# Patient Record
Sex: Male | Born: 1935 | Race: White | Hispanic: No | Smoking: Never smoker
Health system: Southern US, Community
[De-identification: ages and names within clinical notes are randomized; demographics above are authoritative.]

## PROBLEM LIST (undated history)

## (undated) DIAGNOSIS — E785 Hyperlipidemia, unspecified: Secondary | ICD-10-CM

## (undated) DIAGNOSIS — C9210 Chronic myeloid leukemia, BCR/ABL-positive, not having achieved remission: Secondary | ICD-10-CM

## (undated) DIAGNOSIS — N189 Chronic kidney disease, unspecified: Secondary | ICD-10-CM

## (undated) DIAGNOSIS — I5020 Unspecified systolic (congestive) heart failure: Secondary | ICD-10-CM

## (undated) DIAGNOSIS — I350 Nonrheumatic aortic (valve) stenosis: Principal | ICD-10-CM

## (undated) DIAGNOSIS — M1990 Unspecified osteoarthritis, unspecified site: Secondary | ICD-10-CM

## (undated) DIAGNOSIS — N1832 Chronic kidney disease, stage 3b: Secondary | ICD-10-CM

## (undated) DIAGNOSIS — N19 Unspecified kidney failure: Secondary | ICD-10-CM

## (undated) HISTORY — PX: LAMINECTOMY: SHX219

## (undated) HISTORY — PX: APPENDECTOMY: SHX54

## (undated) HISTORY — DX: Nonrheumatic aortic (valve) stenosis: I35.0

## (undated) HISTORY — DX: Unspecified kidney failure: N19

## (undated) HISTORY — DX: Chronic myeloid leukemia, BCR/ABL-positive, not having achieved remission: C92.10

## (undated) HISTORY — DX: Unspecified osteoarthritis, unspecified site: M19.90

## (undated) HISTORY — DX: Chronic kidney disease, unspecified: N18.9

## (undated) HISTORY — DX: Hyperlipidemia, unspecified: E78.5

## (undated) HISTORY — PX: CHOLECYSTECTOMY: SHX55

---

## 2004-08-31 ENCOUNTER — Ambulatory Visit: Payer: Self-pay | Admitting: Cardiology

## 2005-07-05 ENCOUNTER — Ambulatory Visit: Payer: Self-pay | Admitting: Cardiology

## 2007-10-25 ENCOUNTER — Ambulatory Visit (HOSPITAL_COMMUNITY): Admission: RE | Admit: 2007-10-25 | Discharge: 2007-10-26 | Payer: Self-pay | Admitting: Neurosurgery

## 2009-05-14 IMAGING — CR DG CHEST 2V
2 series · 2 of 2 positions shown · non-contrast
Comparison: None

CLINICAL DATA: Lumbar region PE.  Spondylosis.  Preadmission for
surgery.

CHEST - 2 VIEW

[view not recorded (1 of 2)]
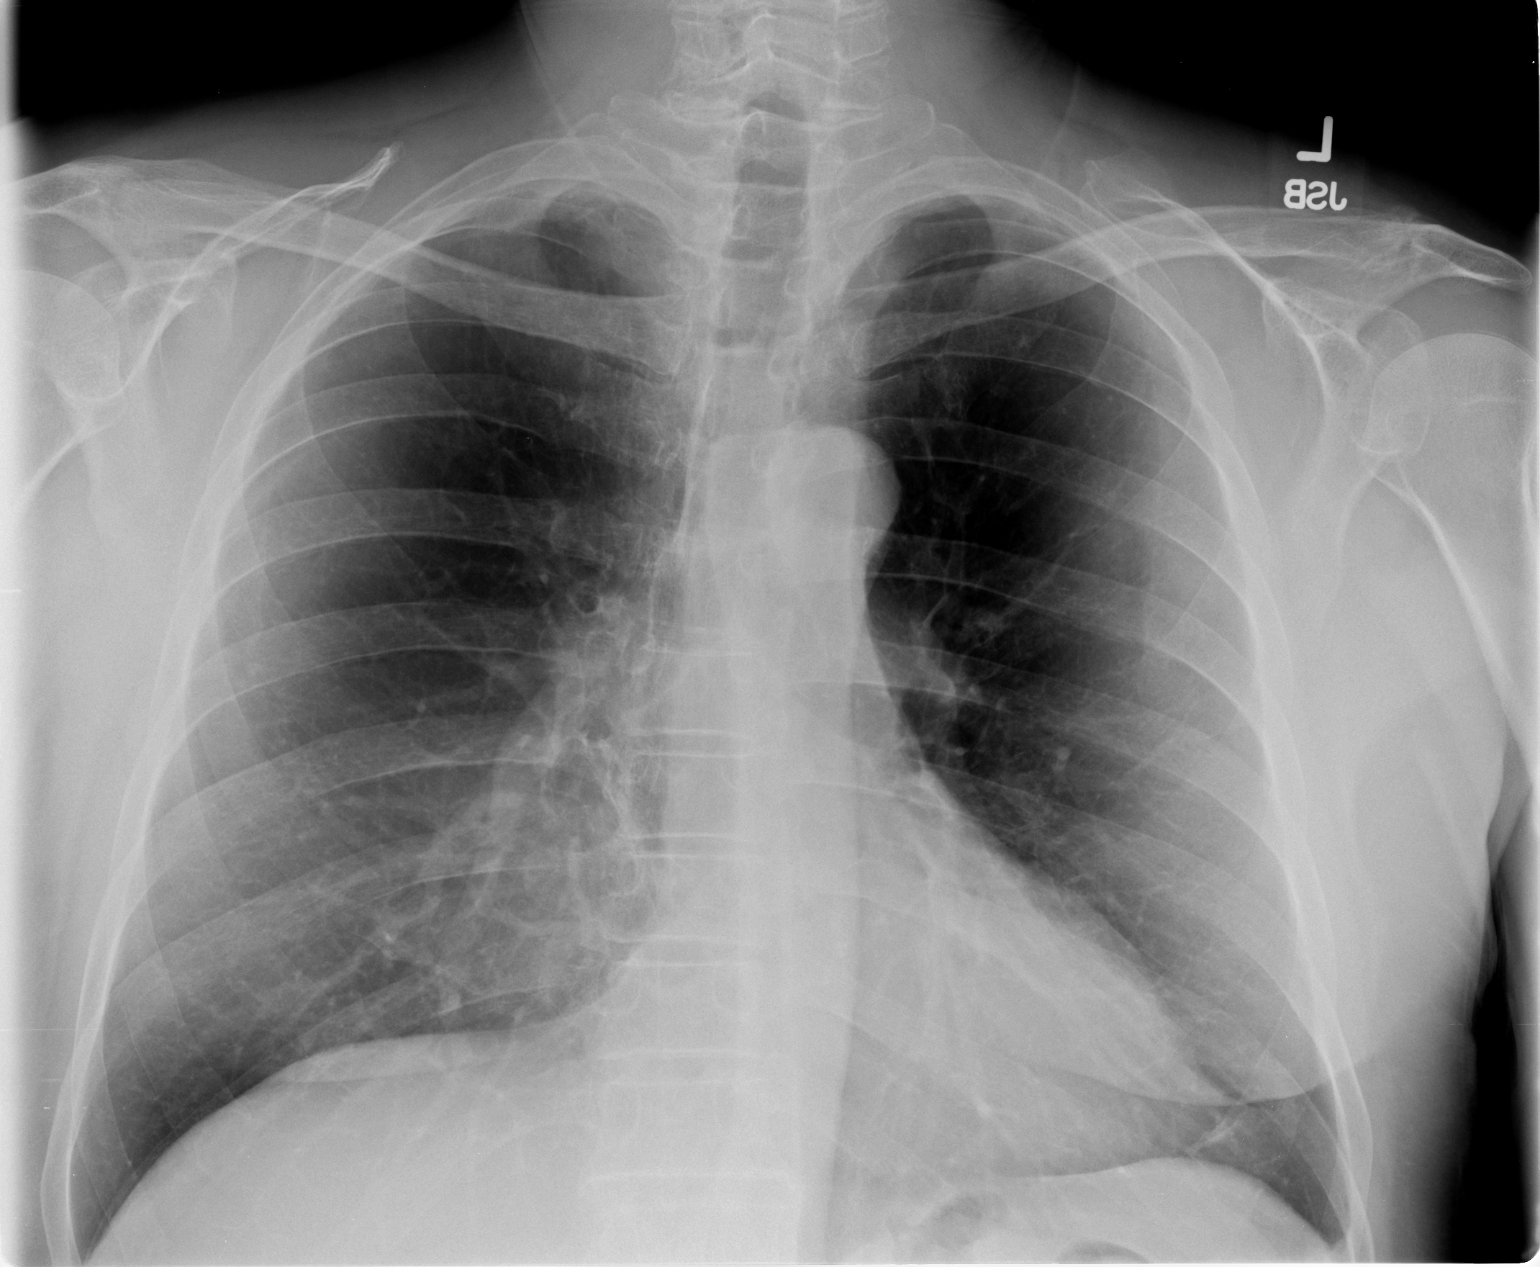

[view not recorded (2 of 2)]
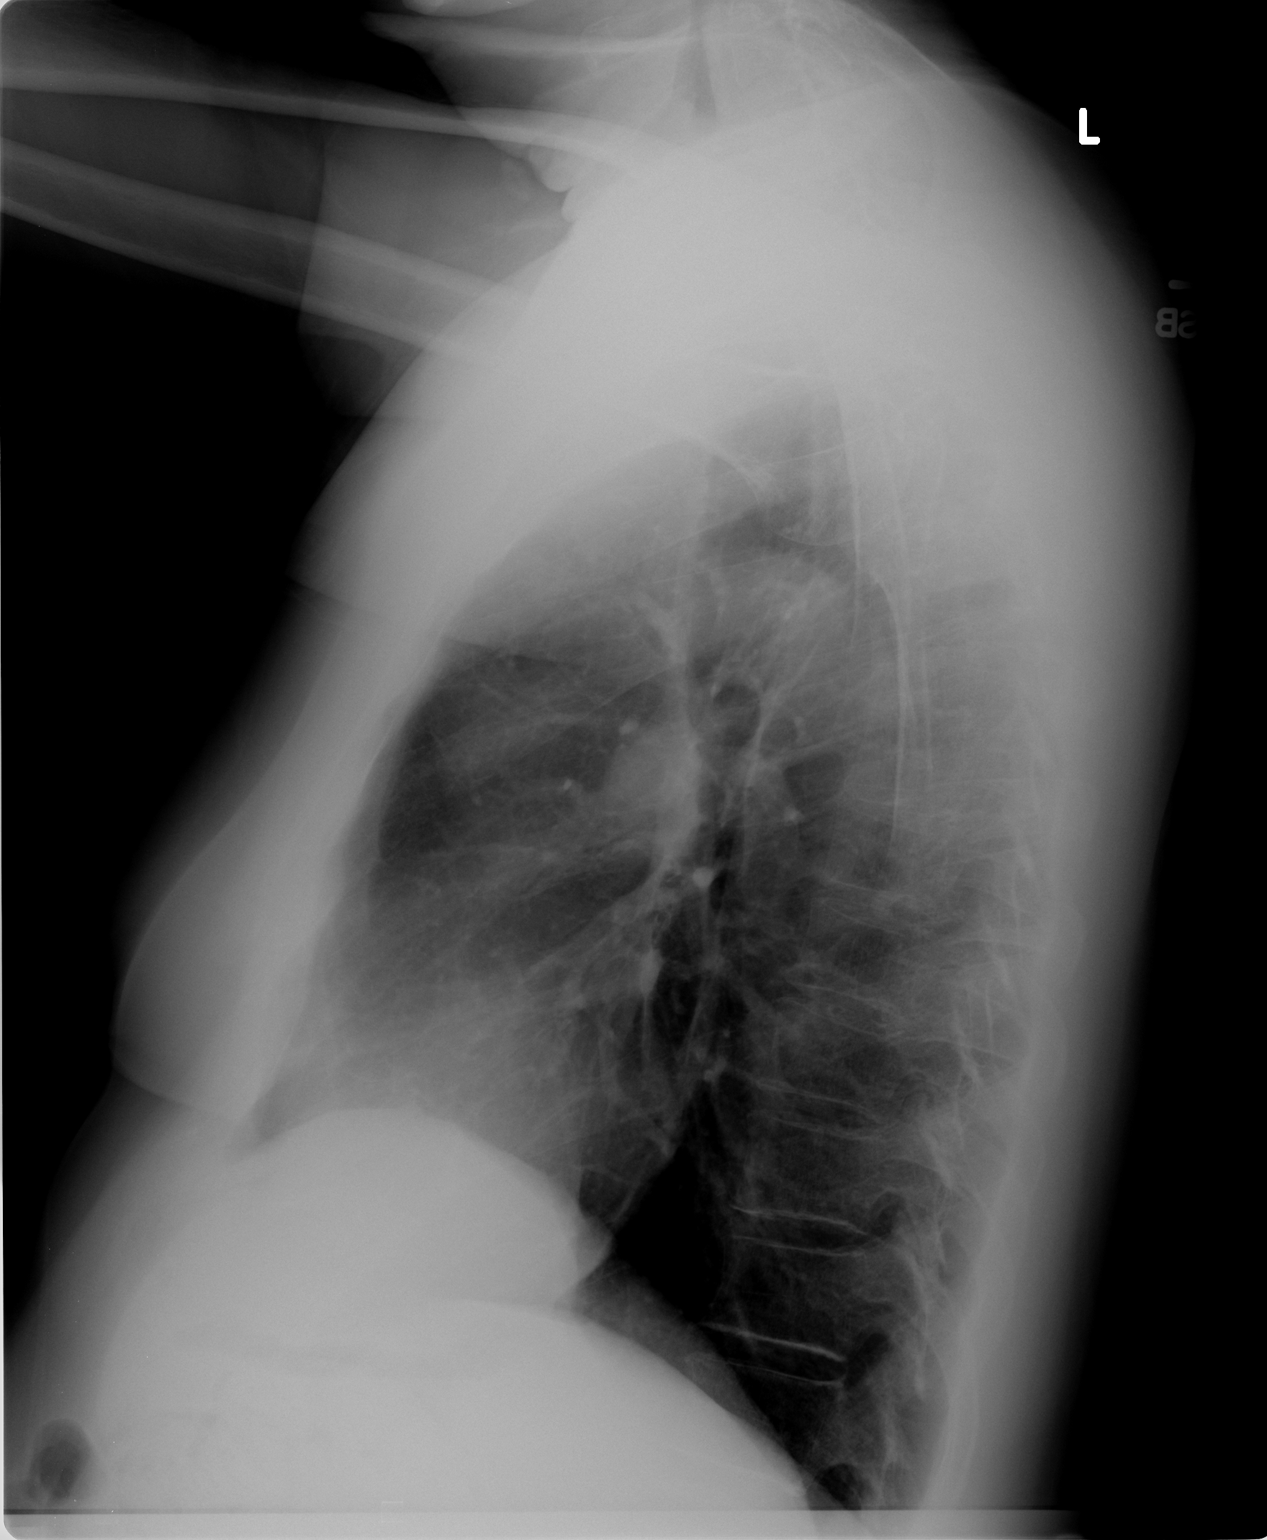

[2 of 2 positions shown; findings below may reference images not displayed]

FINDINGS: Lungs are moderately hyperaerated.  Attenuated appearance
of the pulmonary vasculature particularly in the upper lung zones
and apices.  Findings are suggestive of emphysematous changes.  No
acute pulmonary process.  Normal cardiomediastinal silhouette.
Intact bony thorax.
IMPRESSION: Radiographic findings are consistent with COPD/emphysema.

## 2009-05-15 IMAGING — CR DG LUMBAR SPINE 2-3V
1 series · 1 of 1 positions shown · non-contrast
Comparison: No prior imaging of the lumbar spine is available.

CLINICAL DATA: Lumbar HNP

LUMBAR SPINE - 2-3 VIEW

[view not recorded]
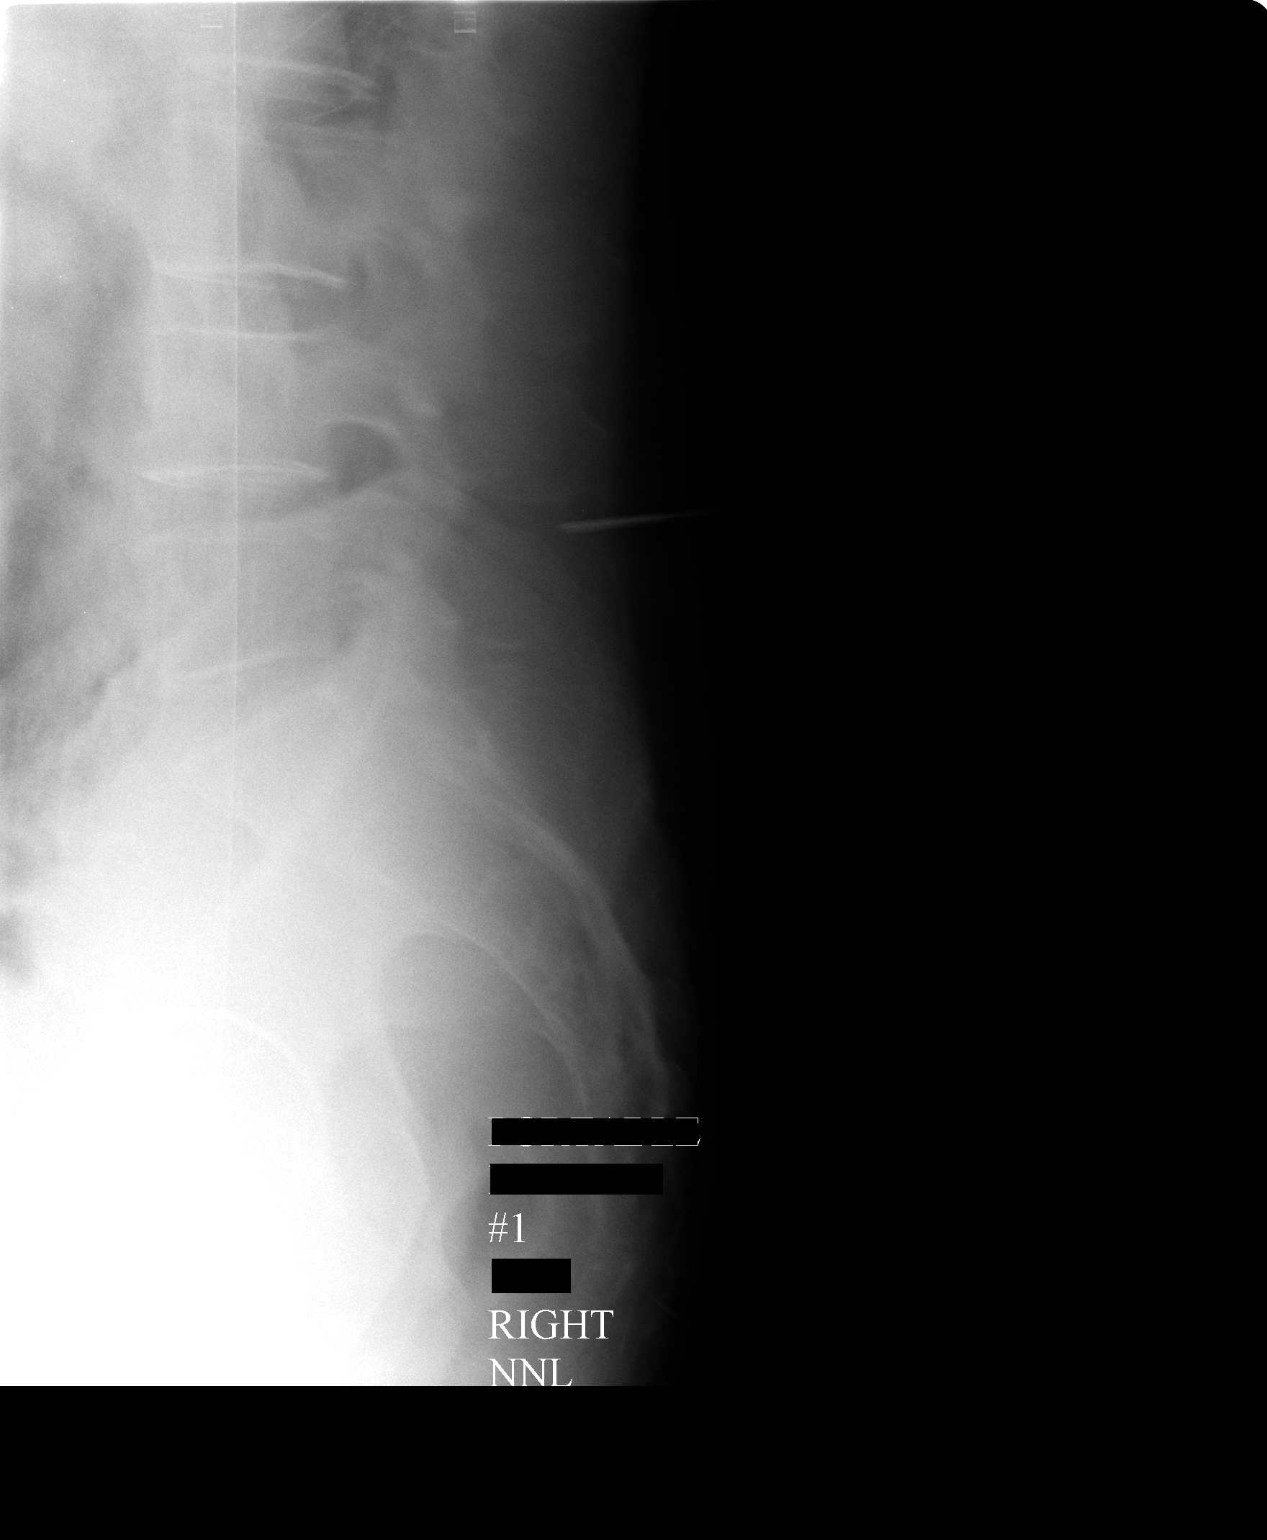

[1 of 1 positions shown; findings below may reference images not displayed]

FINDINGS: No prior images of the lumbar spine are available.  On
these limited images, it is assumed that there are five lumbar type
vertebral bodies, for numbering purposes.

Two portable cross-table lateral views of lumbar spine are
submitted.  Image labeled #1 includes the inferior plate of L2,
through the sacrum (assuming five lumbar type vertebral bodies).  A
radiopaque marker projects over the soft tissues of the back, at
the level of the L4-5 disc space.

Imaged labeled #2 includes the L1-2 disc space through the sacrum.
Radiopaque surgical instruments project at the L5 - S1 level on
image #2.
IMPRESSION: Intraoperative lumbar spine images as described above

## 2010-10-10 NOTE — Op Note (Signed)
Kenneth [name], Kenneth [name]                  ACCOUNT NO.:  [id number]   MEDICAL RECORD NO.:  [id number]          PATIENT TYPE:  INP   LOCATION:  3028                         FACILITY:  [facility]   PHYSICIAN:  Hewitt Shorts, M.D.DATE OF BIRTH:  Oct 26, 1933   DATE OF PROCEDURE:  10/25/2007  DATE OF DISCHARGE:                               OPERATIVE REPORT   PREOPERATIVE DIAGNOSES:  1. Left L5-S1 lumbar disk herniation.  2. Lumbar degenerative disk disease.  3. Lumbar spondylosis.  4. Lumbar radiculopathy.   POSTOPERATIVE DIAGNOSES:  1. Left L5-S1 lumbar disk herniation.  2. Lumbar degenerative disease.  3. Lumbar spondylosis.  4. Lumbar radiculopathy.   PROCEDURE:  Left L5-S1 lumbar laminotomy and microdiskectomy with  microdissection.   SURGEON:  Hewitt Shorts, MD   ASSISTANT:  Heslop Silversmith, NP   ANESTHESIA:  General endotracheal.   INDICATIONS:  A 77 year old man who presented with a left lumbar  radiculopathy.  CT scan showed a large left L5-S1 lumbar disk  herniation.  Decision was made to proceed for elective laminotomy and  microdiskectomy.   PROCEDURE:  The patient brought to the operating room, placed under  general endotracheal anesthesia. The patient was turned to a prone  position.  Lumbar region was prepped with Betadine soap and solution and  draped in a sterile fashion.  The midline was infiltrated with local  anesthetic with epinephrine and x-ray was taken and the L5-S1 level was  identified.  A midline incision was made over the L5-S1 level and  carried down through the subcutaneous tissue.  Bipolar electrocautery  was used to maintain hemostasis.  Dissection was carried down to the  lumbar fascia, which was incised on the left side of the midline, and  the paraspinal muscles were dissected from the spinous process and  lamina in a subperiosteal fashion.  L5-S1 intralaminar space was  identified, and x-ray was taken to the confirm the localization and then  the  microscope was draped and brought to the field to provide additional  navigation, illumination, and visualization.  The remainder of the  decompression was performed using microdissection and microsurgical  technique.  Laminotomy was performed using the X-max drill and Kerrison  punches.  The ligamentum flavum was carefully removed and we identified  the thecal sac and exiting left S1 nerve root.  This gently retracted  medially exposing a large disk herniation.  The remaining annular fibers  were incised and large fragment removed and then we continued the  diskectomy entering into the disk space using a variety of microcurettes  and pituitary rongeurs.  We then further removed disk material from the  epidural space.  In the end, good decompression of the thecal sac and  nerve root was achieved and all loose fragments and disk material  removed from both disk space on the epidural space.  Once the diskectomy  was completed, hemostasis was established with the use of a bipolar  cautery.  The wound was irrigated with Bacitracin solution.  Hemostasis  was confirmed and then we instilled 2 mL of fentanyl and 80 mg of Depo-  Medrol into the epidural space and proceeded with closure.  The deep  fascia was closed with interrupted undyed #1 Vicryl sutures.  Scarpa  fascia was closed with interrupted undyed #1 Vicryl sutures.  The  subcutaneous and subcuticular were closed with interrupted inverted 2-0  undyed Vicryl sutures.  The skin was approximated with Dermabond.  The  procedure was tolerated well.  The estimated blood loss was less than 25  mL.  Sponge and needle count were correct.  Following surgery, the  patient was turned back to supine position to be reversed from the  anesthetic, extubated, and transferred to the recovery room for further  care.      Hewitt Shorts, M.D.  Electronically Signed     RWN/MEDQ  D:  10/25/2007  T:  10/25/2007  Job:  [id number]

## 2011-02-21 LAB — COMPREHENSIVE METABOLIC PANEL
ALT: 29
AST: 28
Albumin: 3.5
Alkaline Phosphatase: 65
BUN: 36 — ABNORMAL HIGH
CO2: 24
Calcium: 9.1
Chloride: 106
Creatinine, Ser: 1.61 — ABNORMAL HIGH
GFR calc Af Amer: 51 — ABNORMAL LOW
GFR calc non Af Amer: 42 — ABNORMAL LOW
Glucose, Bld: 120 — ABNORMAL HIGH
Potassium: 4.2
Sodium: 139
Total Bilirubin: 0.6
Total Protein: 6

## 2011-02-21 LAB — CBC
HCT: 37.5 — ABNORMAL LOW
Hemoglobin: 13.1
MCHC: 34.8
MCV: 102.2 — ABNORMAL HIGH
Platelets: 278
RBC: 3.67 — ABNORMAL LOW
RDW: 15.5
WBC: 14.2 — ABNORMAL HIGH

## 2012-03-21 DIAGNOSIS — N400 Enlarged prostate without lower urinary tract symptoms: Secondary | ICD-10-CM

## 2012-03-21 DIAGNOSIS — E785 Hyperlipidemia, unspecified: Secondary | ICD-10-CM

## 2012-03-21 DIAGNOSIS — C9210 Chronic myeloid leukemia, BCR/ABL-positive, not having achieved remission: Secondary | ICD-10-CM

## 2012-09-29 ENCOUNTER — Encounter (INDEPENDENT_AMBULATORY_CARE_PROVIDER_SITE_OTHER): Payer: BC Managed Care – PPO | Admitting: Internal Medicine

## 2012-09-29 DIAGNOSIS — C9210 Chronic myeloid leukemia, BCR/ABL-positive, not having achieved remission: Secondary | ICD-10-CM

## 2013-04-07 ENCOUNTER — Encounter (INDEPENDENT_AMBULATORY_CARE_PROVIDER_SITE_OTHER): Payer: Medicare Other

## 2013-04-07 DIAGNOSIS — R51 Headache: Secondary | ICD-10-CM

## 2013-04-07 DIAGNOSIS — C9210 Chronic myeloid leukemia, BCR/ABL-positive, not having achieved remission: Secondary | ICD-10-CM

## 2013-04-07 DIAGNOSIS — R509 Fever, unspecified: Secondary | ICD-10-CM

## 2013-04-07 DIAGNOSIS — D649 Anemia, unspecified: Secondary | ICD-10-CM

## 2013-04-17 ENCOUNTER — Encounter (INDEPENDENT_AMBULATORY_CARE_PROVIDER_SITE_OTHER): Payer: Medicare Other

## 2013-04-17 DIAGNOSIS — C9210 Chronic myeloid leukemia, BCR/ABL-positive, not having achieved remission: Secondary | ICD-10-CM

## 2013-04-17 DIAGNOSIS — R509 Fever, unspecified: Secondary | ICD-10-CM

## 2013-04-17 DIAGNOSIS — R339 Retention of urine, unspecified: Secondary | ICD-10-CM

## 2016-02-20 ENCOUNTER — Ambulatory Visit (INDEPENDENT_AMBULATORY_CARE_PROVIDER_SITE_OTHER): Payer: 59 | Admitting: Otolaryngology

## 2016-02-20 DIAGNOSIS — H903 Sensorineural hearing loss, bilateral: Secondary | ICD-10-CM

## 2016-02-20 DIAGNOSIS — H6121 Impacted cerumen, right ear: Secondary | ICD-10-CM

## 2016-08-06 ENCOUNTER — Encounter: Payer: Self-pay | Admitting: Cardiology

## 2016-08-23 ENCOUNTER — Encounter: Payer: Self-pay | Admitting: Cardiology

## 2016-09-17 ENCOUNTER — Encounter: Payer: Self-pay | Admitting: Cardiology

## 2016-09-17 ENCOUNTER — Ambulatory Visit (INDEPENDENT_AMBULATORY_CARE_PROVIDER_SITE_OTHER): Payer: Medicare Other | Admitting: Cardiology

## 2016-09-17 VITALS — BP 154/78 | HR 60 | Ht 69.0 in | Wt 179.4 lb

## 2016-09-17 DIAGNOSIS — E782 Mixed hyperlipidemia: Secondary | ICD-10-CM

## 2016-09-17 DIAGNOSIS — I350 Nonrheumatic aortic (valve) stenosis: Secondary | ICD-10-CM

## 2016-09-17 DIAGNOSIS — R011 Cardiac murmur, unspecified: Secondary | ICD-10-CM

## 2016-09-17 DIAGNOSIS — R030 Elevated blood-pressure reading, without diagnosis of hypertension: Secondary | ICD-10-CM

## 2016-09-17 NOTE — Progress Notes (Signed)
Clinical Summary Kenneth [name] is a 83 y.o.male seen as new patient, he is referred by Dr Quintin Alto for aortic stenosis.   1. Aortic stenosis - 07/2016 LVEF 56-81%, grade I diastolic dysfunction, dilated aortic root and ascending aorta. Moderate AS - mean grad 12, AVA VTI 1.04, mod PI - remains very active. Uses push mower regularly without troubles, works out with his grandsons baseball team regularly. Does heavy yard work regularly.  2. HTN - followed by pcp - checks at home 120s/60s.   3. Hyperlipidemia - followed by pcp - reports recent labs with pcp. Has been on simva statin for long time.  - 07/2016 TC 111 TG 81 HDL 42 LDL 53     Past Medical History:  Diagnosis Date  . Aortic stenosis   . Hyperlipidemia   . Kidney failure   . Osteoarthritis      Allergies not on file   Current Outpatient Prescriptions  Medication Sig Dispense Refill  . finasteride (PROSCAR) 5 MG tablet Take 5 mg by mouth daily.    . simvastatin (ZOCOR) 40 MG tablet Take 40 mg by mouth daily.    . tamsulosin (FLOMAX) 0.4 MG CAPS capsule Take 0.4 mg by mouth.     No current facility-administered medications for this visit.      Past Surgical History:  Procedure Laterality Date  . APPENDECTOMY    . CHOLECYSTECTOMY    . LAMINECTOMY       Allergies not on file    Family History  Problem Relation Age of Onset  . Kidney failure Mother   . Heart attack Father   . Heart attack Sister   . Prostate cancer Brother      Social History Mr. Mcshan has no tobacco history on file. Mr. Croom has no alcohol history on file.   Review of Systems CONSTITUTIONAL: No weight loss, fever, chills, weakness or fatigue.  HEENT: Eyes: No visual loss, blurred vision, double vision or yellow sclerae.No hearing loss, sneezing, congestion, runny nose or sore throat.  SKIN: No rash or itching.  CARDIOVASCULAR: per HPI RESPIRATORY: No shortness of breath, cough or sputum.  GASTROINTESTINAL: No anorexia,  nausea, vomiting or diarrhea. No abdominal pain or blood.  GENITOURINARY: No burning on urination, no polyuria NEUROLOGICAL: No headache, dizziness, syncope, paralysis, ataxia, numbness or tingling in the extremities. No change in bowel or bladder control.  MUSCULOSKELETAL: No muscle, back pain, joint pain or stiffness.  LYMPHATICS: No enlarged nodes. No history of splenectomy.  PSYCHIATRIC: No history of depression or anxiety.  ENDOCRINOLOGIC: No reports of sweating, cold or heat intolerance. No polyuria or polydipsia.  [name]   Physical Examination Vitals:   09/17/16 0838 09/17/16 0842  BP: (!) 153/83 (!) 154/78  Pulse: 62 60   Vitals:   09/17/16 0838  Weight: 179 lb 6.4 oz (81.4 kg)  Height: 5\' 9"  (1.753 m)    Gen: resting comfortably, no acute distress HEENT: no scleral icterus, pupils equal round and reactive, no palptable cervical adenopathy,  CV: RRR, 3/6 systolic murmur RUSB, no jvd Resp: Clear to auscultation bilaterally GI: abdomen is soft, non-tender, non-distended, normal bowel sounds, no hepatosplenomegaly MSK: extremities are warm, no edema.  Skin: warm, no rash Neuro:  no focal deficits Psych: appropriate affect     Assessment and Plan  1. Aortic stenosis - mild to moderate by echo. No significant symptoms - continue to monitor at this time - EKG in clinic shows NSR  2. Elevated bp - elevated  in clinic, home numbers 120/60s, as well as most recent pcp visit numbers.  - continue to monitor at this time  3. Hyperlipidemia At goal, continue current statin      [name], M.D.

## 2016-09-17 NOTE — Patient Instructions (Signed)

## 2017-02-21 ENCOUNTER — Ambulatory Visit (INDEPENDENT_AMBULATORY_CARE_PROVIDER_SITE_OTHER): Payer: Medicare Other | Admitting: Otolaryngology

## 2017-02-21 DIAGNOSIS — H903 Sensorineural hearing loss, bilateral: Secondary | ICD-10-CM | POA: Diagnosis not present

## 2017-09-24 ENCOUNTER — Ambulatory Visit: Payer: Medicare Other | Admitting: Cardiology

## 2017-09-24 ENCOUNTER — Encounter: Payer: Self-pay | Admitting: Cardiology

## 2017-09-24 VITALS — BP 147/87 | HR 73 | Ht 68.0 in | Wt 176.0 lb

## 2017-09-24 DIAGNOSIS — I350 Nonrheumatic aortic (valve) stenosis: Secondary | ICD-10-CM | POA: Diagnosis not present

## 2017-09-24 DIAGNOSIS — R030 Elevated blood-pressure reading, without diagnosis of hypertension: Secondary | ICD-10-CM | POA: Diagnosis not present

## 2017-09-24 DIAGNOSIS — E782 Mixed hyperlipidemia: Secondary | ICD-10-CM | POA: Diagnosis not present

## 2017-09-24 NOTE — Patient Instructions (Signed)
Your physician wants you to follow-up in: 1 YEAR WITH DR BRANCH You will receive a reminder letter in the mail two months in advance. If you don't receive a letter, please call our office to schedule the follow-up appointment.  Your physician recommends that you continue on your current medications as directed. Please refer to the Current Medication list given to you today.  Your physician has requested that you have an echocardiogram. Echocardiography is a painless test that uses sound waves to create images of your heart. It provides your doctor with information about the size and shape of your heart and how well your heart's chambers and valves are working. This procedure takes approximately one hour. There are no restrictions for this procedure.  Thank you for choosing [facility]!!    

## 2017-09-24 NOTE — Progress Notes (Signed)
Clinical Summary Kenneth [name] is a 84 y.o.male  1. Aortic stenosis - 07/2016 LVEF 81-19%, grade I diastolic dysfunction, dilated aortic root and ascending aorta. Moderate AS - mean grad 12, AVA VTI 1.04, mod PI  - no recent SOB/DOE. No chest pain. No syncope.   2. HTN - home bp's and his pcp usually run around 120-130s/60s  3. Hyperlipidemia - Has been on simva statin for long time.  - 07/2016 TC 111 TG 81 HDL 42 LDL 53 - compliant with meds  Past Medical History:  Diagnosis Date  . Aortic stenosis   . Hyperlipidemia   . Kidney failure   . Osteoarthritis      No Known Allergies   Current Outpatient Medications  Medication Sig Dispense Refill  . finasteride (PROSCAR) 5 MG tablet Take 5 mg by mouth daily.    [name] imatinib (GLEEVEC) 100 MG tablet Take 100 mg by mouth daily. Take with meals and large glass of water.Caution:Chemotherapy    . multivitamin-lutein (OCUVITE-LUTEIN) CAPS capsule Take 1 capsule by mouth daily.    . simvastatin (ZOCOR) 40 MG tablet Take 40 mg by mouth daily.    . tamsulosin (FLOMAX) 0.4 MG CAPS capsule Take 0.4 mg by mouth.     No current facility-administered medications for this visit.         No Known Allergies    Family History  Problem Relation Age of Onset  . Kidney failure Mother   . Heart attack Father   . Heart attack Sister   . Prostate cancer Brother      Social History Kenneth [name] reports that he has never smoked. He has never used smokeless tobacco. Kenneth [name] has no alcohol history on file.   Review of Systems CONSTITUTIONAL: No weight loss, fever, chills, weakness or fatigue.  HEENT: Eyes: No visual loss, blurred vision, double vision or yellow sclerae.No hearing loss, sneezing, congestion, runny nose or sore throat.  SKIN: No rash or itching.  CARDIOVASCULAR: per hpi RESPIRATORY: per hpi GASTROINTESTINAL: No anorexia, nausea, vomiting or diarrhea. No abdominal pain or blood.  GENITOURINARY: No burning on urination,  no polyuria NEUROLOGICAL: No headache, dizziness, syncope, paralysis, ataxia, numbness or tingling in the extremities. No change in bowel or bladder control.  MUSCULOSKELETAL: No muscle, back pain, joint pain or stiffness.  LYMPHATICS: No enlarged nodes. No history of splenectomy.  PSYCHIATRIC: No history of depression or anxiety.  ENDOCRINOLOGIC: No reports of sweating, cold or heat intolerance. No polyuria or polydipsia.  [name]   Physical Examination Vitals:   09/24/17 1321  BP: (!) 147/87  Pulse: 73  SpO2: 98%   Vitals:   09/24/17 1321  Weight: 176 lb (79.8 kg)  Height: 5\' 8"  (1.727 m)    Gen: resting comfortably, no acute distress HEENT: no scleral icterus, pupils equal round and reactive, no palptable cervical adenopathy,  CV: RRR, 3/6 systolic murmur rusb, no jvd Resp: Clear to auscultation bilaterally GI: abdomen is soft, non-tender, non-distended, normal bowel sounds, no hepatosplenomegaly MSK: extremities are warm, no edema.  Skin: warm, no rash Neuro:  no focal deficits Psych: appropriate affect     Assessment and Plan   1. Aortic stenosis - mild to moderate by echo.  - remains asymptomatic - repeat echo for surveillance.  - EKG today show SR, LVH  2. Elevated bp - elevated in clinic often, home numbers at goal - continue to monitor.   3. Hyperlipidemia -continue statin, at goal - request labs from pcp  F/u 1 year.   [name], M.D

## 2017-09-25 ENCOUNTER — Encounter: Payer: Self-pay | Admitting: *Deleted

## 2017-09-29 ENCOUNTER — Encounter: Payer: Self-pay | Admitting: Cardiology

## 2017-10-16 ENCOUNTER — Ambulatory Visit (INDEPENDENT_AMBULATORY_CARE_PROVIDER_SITE_OTHER): Payer: Medicare Other

## 2017-10-16 ENCOUNTER — Other Ambulatory Visit: Payer: Self-pay

## 2017-10-16 DIAGNOSIS — I350 Nonrheumatic aortic (valve) stenosis: Secondary | ICD-10-CM

## 2017-10-18 ENCOUNTER — Telehealth: Payer: Self-pay | Admitting: *Deleted

## 2017-10-18 NOTE — Telephone Encounter (Signed)
Pt aware and voiced understanding - routed to pcp  

## 2017-10-18 NOTE — Telephone Encounter (Signed)
-----   Message from Drema Dallas, [place] sent at 10/16/2017 12:12 PM EDT -----   ----- Message ----- From: Charlie Pitter, PA-C Sent: 10/16/2017  12:00 PM To: [name], MD, Drema Dallas, CMA  Please let patient know that echo showed normal heart function and continued moderate aortic stenosis. The echocardiogram suggested there is some stiffening of the heart muscle, which is what we call diastolic dysfunction. In addition to good blood pressure control and achieving or maintaining a healthy weight, would advise to generally stick to lower sodium diet (aiming for maximum 2,000mg  per day) and staying hydrated but not to excess. In general about 64oz per day of all fluid intake would be an ideal maximum. There is a small fluid collection around his heart and I will defer to Dr Harl Bowie whether he would recommend further follow-up of this. Dayna Dunn PA-C

## 2018-03-20 ENCOUNTER — Ambulatory Visit (INDEPENDENT_AMBULATORY_CARE_PROVIDER_SITE_OTHER): Payer: Medicare Other | Admitting: Otolaryngology

## 2018-03-20 DIAGNOSIS — H903 Sensorineural hearing loss, bilateral: Secondary | ICD-10-CM

## 2018-09-19 ENCOUNTER — Telehealth: Payer: Self-pay | Admitting: *Deleted

## 2018-09-19 NOTE — Telephone Encounter (Signed)
   Primary Cardiologist:  Carlyle Dolly, MD   Patient contacted.  History reviewed.  No symptoms to suggest any unstable cardiac conditions.  Based on discussion, with current pandemic situation, we will be postponing this appointment for Kenneth [name]. with a plan for f/u in July 2020 or sooner if feasible/necessary.  If symptoms change, he has been instructed to contact our office.    Marlou Sa, RN  09/19/2018 2:06 PM         .

## 2018-09-24 ENCOUNTER — Ambulatory Visit: Payer: Medicare Other | Admitting: Cardiology

## 2018-11-26 ENCOUNTER — Ambulatory Visit: Payer: Medicare Other | Admitting: Cardiology

## 2019-02-12 ENCOUNTER — Telehealth: Payer: Self-pay | Admitting: Cardiology

## 2019-02-12 NOTE — Telephone Encounter (Signed)

## 2019-02-17 ENCOUNTER — Encounter: Payer: Self-pay | Admitting: Cardiology

## 2019-02-17 ENCOUNTER — Ambulatory Visit: Payer: Medicare Other | Admitting: Cardiology

## 2019-02-17 ENCOUNTER — Other Ambulatory Visit: Payer: Self-pay

## 2019-02-17 VITALS — BP 128/71 | HR 66 | Ht 68.0 in | Wt 170.0 lb

## 2019-02-17 DIAGNOSIS — R030 Elevated blood-pressure reading, without diagnosis of hypertension: Secondary | ICD-10-CM

## 2019-02-17 DIAGNOSIS — Z23 Encounter for immunization: Secondary | ICD-10-CM | POA: Diagnosis not present

## 2019-02-17 DIAGNOSIS — I350 Nonrheumatic aortic (valve) stenosis: Secondary | ICD-10-CM | POA: Diagnosis not present

## 2019-02-17 NOTE — Patient Instructions (Signed)
Your physician wants you to follow-up in: 1 YEAR WITH DR BRANCH You will receive a reminder letter in the mail two months in advance. If you don't receive a letter, please call our office to schedule the follow-up appointment.  Your physician recommends that you continue on your current medications as directed. Please refer to the Current Medication list given to you today.  Your physician has requested that you have an echocardiogram. Echocardiography is a painless test that uses sound waves to create images of your heart. It provides your doctor with information about the size and shape of your heart and how well your heart's chambers and valves are working. This procedure takes approximately one hour. There are no restrictions for this procedure.  Thank you for choosing [facility]!!    

## 2019-02-17 NOTE — Progress Notes (Signed)
Clinical Summary Kenneth [name] is a 85 y.o.male seen today for follow up of the following medical problems.   1. Aortic stenosis - 07/2016 LVEF 99991111, grade I diastolic dysfunction, dilated aortic root and ascending aorta. Moderate AS - mean grad 12, AVA VTI 1.04, mod PI   09/2017 echo LVEF 55-60%, moderate AS (AVA VTI 1.16, mean grad 17) - no recent symptoms. Active in his yard without troubles.     2. Hyperlipidemia - Has been on simva statin for long time. - recent labs with pcp   Past Medical History:  Diagnosis Date  . Aortic stenosis   . Hyperlipidemia   . Kidney failure   . Osteoarthritis      No Known Allergies   Current Outpatient Medications  Medication Sig Dispense Refill  . finasteride (PROSCAR) 5 MG tablet Take 5 mg by mouth daily.    [name] imatinib (GLEEVEC) 400 MG tablet Take 400 mg by mouth daily. Take with meals and large glass of water.Caution:Chemotherapy.    . multivitamin-lutein (OCUVITE-LUTEIN) CAPS capsule Take 1 capsule by mouth daily.    . simvastatin (ZOCOR) 40 MG tablet Take 40 mg by mouth daily.    . tamsulosin (FLOMAX) 0.4 MG CAPS capsule Take 0.4 mg by mouth.     No current facility-administered medications for this visit.      Past Surgical History:  Procedure Laterality Date  . APPENDECTOMY    . CHOLECYSTECTOMY    . LAMINECTOMY       No Known Allergies    Family History  Problem Relation Age of Onset  . Kidney failure Mother   . Heart attack Father   . Heart attack Sister   . Prostate cancer Brother      Social History [name] reports that he has never smoked. He has never used smokeless tobacco. [name] reports no history of alcohol use.   Review of Systems CONSTITUTIONAL: No weight loss, fever, chills, weakness or fatigue.  HEENT: Eyes: No visual loss, blurred vision, double vision or yellow sclerae.No hearing loss, sneezing, congestion, runny nose or sore throat.  SKIN: No rash or itching.  CARDIOVASCULAR:  per hpi RESPIRATORY: No shortness of breath, cough or sputum.  GASTROINTESTINAL: No anorexia, nausea, vomiting or diarrhea. No abdominal pain or blood.  GENITOURINARY: No burning on urination, no polyuria NEUROLOGICAL: No headache, dizziness, syncope, paralysis, ataxia, numbness or tingling in the extremities. No change in bowel or bladder control.  MUSCULOSKELETAL: No muscle, back pain, joint pain or stiffness.  LYMPHATICS: No enlarged nodes. No history of splenectomy.  PSYCHIATRIC: No history of depression or anxiety.  ENDOCRINOLOGIC: No reports of sweating, cold or heat intolerance. No polyuria or polydipsia.  [name]   Physical Examination Today's Vitals   02/17/19 1358  BP: 128/71  Pulse: 66  SpO2: 97%  Weight: 170 lb (77.1 kg)  Height: 5\' 8"  (1.727 m)   Body mass index is 25.85 kg/m.  Gen: resting comfortably, no acute distress HEENT: no scleral icterus, pupils equal round and reactive, no palptable cervical adenopathy,  CV: RRR, no m/rg, no jvd Resp: Clear to auscultation bilaterally GI: abdomen is soft, non-tender, non-distended, normal bowel sounds, no hepatosplenomegaly MSK: extremities are warm, no edema.  Skin: warm, no rash Neuro:  no focal deficits Psych: appropriate affect   Diagnostic Studies  09/2017 echo Study Conclusions  - Left ventricle: The cavity size was normal. Wall thickness was   increased in a pattern of mild LVH. Systolic function was normal.  The estimated ejection fraction was in the range of 55% to 60%.   Wall motion was normal; there were no regional wall motion   abnormalities. Doppler parameters are consistent with abnormal   left ventricular relaxation (grade 1 diastolic dysfunction). - Aortic valve: Moderately calcified annulus. Moderately calcified   leaflets. There was moderate stenosis. There was trivial   regurgitation. Mean gradient (S): 17 mm Hg. Peak gradient (S): 31   mm Hg. VTI ratio of LVOT to aortic valve: 0.34. Valve area  (VTI):   1.16 cm^2. Valve area (Vmax): 1.19 cm^2. Valve area (Vmean): 1.17   cm^2. - Mitral valve: Mildly thickened leaflets. There was mild   regurgitation. - Left atrium: The atrium was mildly dilated. - Atrial septum: No defect or patent foramen ovale was identified. - Tricuspid valve: There was mild regurgitation. - Pulmonary arteries: PA peak pressure: 18 mm Hg (S). - Pericardium, extracardiac: A small pericardial effusion was   identified circumferential to the heart.   Assessment and Plan  1. Aortic stenosis - no recent symptoms, continue surveillance and will repeat echo.    2. Hyperlipidemia -request pcp labs, continue statin  EKG today SR, no acute ischemic changes   F/u 1 year.      [name], M.D

## 2019-02-18 ENCOUNTER — Other Ambulatory Visit: Payer: Self-pay

## 2019-02-18 ENCOUNTER — Ambulatory Visit (INDEPENDENT_AMBULATORY_CARE_PROVIDER_SITE_OTHER): Payer: Medicare Other

## 2019-02-18 DIAGNOSIS — I350 Nonrheumatic aortic (valve) stenosis: Secondary | ICD-10-CM

## 2019-02-20 ENCOUNTER — Telehealth: Payer: Self-pay | Admitting: *Deleted

## 2019-02-20 NOTE — Telephone Encounter (Signed)
Pt voiced understanding - routed to pcp  

## 2019-02-20 NOTE — Telephone Encounter (Signed)
-----   Message from [name], MD sent at 02/19/2019 12:51 PM EDT ----- Echo shows normal function, aortic valve remains mild to modeartely stiffened but overall stable. We will continue to monitor   Zandra Abts MD

## 2019-03-04 ENCOUNTER — Encounter: Payer: Self-pay | Admitting: *Deleted

## 2019-03-19 ENCOUNTER — Ambulatory Visit (INDEPENDENT_AMBULATORY_CARE_PROVIDER_SITE_OTHER): Payer: Medicare Other | Admitting: Otolaryngology

## 2020-07-26 DIAGNOSIS — C9510 Chronic leukemia of unspecified cell type not having achieved remission: Secondary | ICD-10-CM | POA: Diagnosis not present

## 2020-07-26 DIAGNOSIS — H3530 Unspecified macular degeneration: Secondary | ICD-10-CM | POA: Diagnosis not present

## 2020-07-26 DIAGNOSIS — E785 Hyperlipidemia, unspecified: Secondary | ICD-10-CM | POA: Diagnosis not present

## 2020-07-26 DIAGNOSIS — N400 Enlarged prostate without lower urinary tract symptoms: Secondary | ICD-10-CM | POA: Diagnosis not present

## 2020-07-26 DIAGNOSIS — R030 Elevated blood-pressure reading, without diagnosis of hypertension: Secondary | ICD-10-CM | POA: Diagnosis not present

## 2020-09-05 DIAGNOSIS — Z1321 Encounter for screening for nutritional disorder: Secondary | ICD-10-CM | POA: Diagnosis not present

## 2020-09-05 DIAGNOSIS — E782 Mixed hyperlipidemia: Secondary | ICD-10-CM | POA: Diagnosis not present

## 2020-09-05 DIAGNOSIS — C9211 Chronic myeloid leukemia, BCR/ABL-positive, in remission: Secondary | ICD-10-CM | POA: Diagnosis not present

## 2020-09-05 DIAGNOSIS — E7849 Other hyperlipidemia: Secondary | ICD-10-CM | POA: Diagnosis not present

## 2020-09-05 DIAGNOSIS — K2100 Gastro-esophageal reflux disease with esophagitis, without bleeding: Secondary | ICD-10-CM | POA: Diagnosis not present

## 2020-09-05 DIAGNOSIS — E041 Nontoxic single thyroid nodule: Secondary | ICD-10-CM | POA: Diagnosis not present

## 2020-09-05 DIAGNOSIS — N1832 Chronic kidney disease, stage 3b: Secondary | ICD-10-CM | POA: Diagnosis not present

## 2020-09-08 DIAGNOSIS — I350 Nonrheumatic aortic (valve) stenosis: Secondary | ICD-10-CM | POA: Diagnosis not present

## 2020-09-08 DIAGNOSIS — C9211 Chronic myeloid leukemia, BCR/ABL-positive, in remission: Secondary | ICD-10-CM | POA: Diagnosis not present

## 2020-09-08 DIAGNOSIS — K2100 Gastro-esophageal reflux disease with esophagitis, without bleeding: Secondary | ICD-10-CM | POA: Diagnosis not present

## 2020-09-08 DIAGNOSIS — Z0001 Encounter for general adult medical examination with abnormal findings: Secondary | ICD-10-CM | POA: Diagnosis not present

## 2020-09-08 DIAGNOSIS — E0540 Thyrotoxicosis factitia without thyrotoxic crisis or storm: Secondary | ICD-10-CM | POA: Diagnosis not present

## 2020-09-08 DIAGNOSIS — E041 Nontoxic single thyroid nodule: Secondary | ICD-10-CM | POA: Diagnosis not present

## 2020-09-08 DIAGNOSIS — N1832 Chronic kidney disease, stage 3b: Secondary | ICD-10-CM | POA: Diagnosis not present

## 2020-09-08 DIAGNOSIS — E7849 Other hyperlipidemia: Secondary | ICD-10-CM | POA: Diagnosis not present

## 2020-10-25 DIAGNOSIS — H903 Sensorineural hearing loss, bilateral: Secondary | ICD-10-CM | POA: Diagnosis not present

## 2020-10-25 DIAGNOSIS — H8001 Otosclerosis involving oval window, nonobliterative, right ear: Secondary | ICD-10-CM | POA: Diagnosis not present

## 2020-11-22 DIAGNOSIS — H353132 Nonexudative age-related macular degeneration, bilateral, intermediate dry stage: Secondary | ICD-10-CM | POA: Diagnosis not present

## 2021-03-07 DIAGNOSIS — R5382 Chronic fatigue, unspecified: Secondary | ICD-10-CM | POA: Diagnosis not present

## 2021-03-07 DIAGNOSIS — E7849 Other hyperlipidemia: Secondary | ICD-10-CM | POA: Diagnosis not present

## 2021-03-07 DIAGNOSIS — E782 Mixed hyperlipidemia: Secondary | ICD-10-CM | POA: Diagnosis not present

## 2021-03-07 DIAGNOSIS — N1832 Chronic kidney disease, stage 3b: Secondary | ICD-10-CM | POA: Diagnosis not present

## 2021-03-07 DIAGNOSIS — Z1329 Encounter for screening for other suspected endocrine disorder: Secondary | ICD-10-CM | POA: Diagnosis not present

## 2021-03-07 DIAGNOSIS — K2100 Gastro-esophageal reflux disease with esophagitis, without bleeding: Secondary | ICD-10-CM | POA: Diagnosis not present

## 2021-03-10 DIAGNOSIS — E041 Nontoxic single thyroid nodule: Secondary | ICD-10-CM | POA: Diagnosis not present

## 2021-03-10 DIAGNOSIS — E0540 Thyrotoxicosis factitia without thyrotoxic crisis or storm: Secondary | ICD-10-CM | POA: Diagnosis not present

## 2021-03-10 DIAGNOSIS — I350 Nonrheumatic aortic (valve) stenosis: Secondary | ICD-10-CM | POA: Diagnosis not present

## 2021-03-10 DIAGNOSIS — N1832 Chronic kidney disease, stage 3b: Secondary | ICD-10-CM | POA: Diagnosis not present

## 2021-03-10 DIAGNOSIS — Z7189 Other specified counseling: Secondary | ICD-10-CM | POA: Diagnosis not present

## 2021-03-10 DIAGNOSIS — H353190 Nonexudative age-related macular degeneration, unspecified eye, stage unspecified: Secondary | ICD-10-CM | POA: Diagnosis not present

## 2021-03-10 DIAGNOSIS — C9211 Chronic myeloid leukemia, BCR/ABL-positive, in remission: Secondary | ICD-10-CM | POA: Diagnosis not present

## 2021-03-10 DIAGNOSIS — E7849 Other hyperlipidemia: Secondary | ICD-10-CM | POA: Diagnosis not present

## 2021-03-14 ENCOUNTER — Encounter: Payer: Self-pay | Admitting: Cardiology

## 2021-03-14 ENCOUNTER — Encounter: Payer: Self-pay | Admitting: *Deleted

## 2021-03-14 ENCOUNTER — Ambulatory Visit: Payer: Medicare PPO | Admitting: Cardiology

## 2021-03-14 VITALS — BP 132/76 | HR 87 | Ht 68.0 in | Wt 164.8 lb

## 2021-03-14 DIAGNOSIS — I491 Atrial premature depolarization: Secondary | ICD-10-CM | POA: Diagnosis not present

## 2021-03-14 DIAGNOSIS — Z136 Encounter for screening for cardiovascular disorders: Secondary | ICD-10-CM | POA: Diagnosis not present

## 2021-03-14 DIAGNOSIS — R0989 Other specified symptoms and signs involving the circulatory and respiratory systems: Secondary | ICD-10-CM

## 2021-03-14 DIAGNOSIS — I350 Nonrheumatic aortic (valve) stenosis: Secondary | ICD-10-CM | POA: Diagnosis not present

## 2021-03-14 DIAGNOSIS — E782 Mixed hyperlipidemia: Secondary | ICD-10-CM | POA: Diagnosis not present

## 2021-03-14 NOTE — Patient Instructions (Signed)
Medication Instructions:  Continue all current medications.  Labwork: none  Testing/Procedures: Your physician has requested that you have an echocardiogram. Echocardiography is a painless test that uses sound waves to create images of your heart. It provides your doctor with information about the size and shape of your heart and how well your heart's chambers and valves are working. This procedure takes approximately one hour. There are no restrictions for this procedure. Your physician has requested that you have a carotid duplex. This test is an ultrasound of the carotid arteries in your neck. It looks at blood flow through these arteries that supply the brain with blood. Allow one hour for this exam. There are no restrictions or special instructions. Office will contact with results via phone or letter.     Follow-Up:  Your physician wants you to follow up in:  1 year.  You will receive a reminder letter in the mail one-two months in advance.  If you don't receive a letter, please call our office to schedule the follow up appointment    Any Other Special Instructions Will Be Listed Below (If Applicable).   If you need a refill on your cardiac medications before your next appointment, please call your pharmacy.

## 2021-03-14 NOTE — Progress Notes (Signed)
Clinical Summary Kenneth [name] is a 87 y.o.male seen today for follow up of the following medical problems.    1. Aortic stenosis - 07/2016 LVEF 46-96%, grade I diastolic dysfunction, dilated aortic root and ascending aorta. Moderate AS - mean grad 12, AVA VTI 1.04, mod PI    09/2017 echo LVEF 55-60%, moderate AS (AVA VTI 1.16, mean grad 17)     01/2019 echo LVEF 55-60%, grade I dd, severe LAE, mild to mod AS mean gradd 16.6 AVA VTI 1.12 - no recent chest pains, no SOB/DOE, no chest pain.  - does regular yardwork without symptoms.       2. Hyperlipidemia - Has been on simva statin for long time.  - labs followed by pcp  3. PACs - denies any symptoms   Past Medical History:  Diagnosis Date   Aortic stenosis    Hyperlipidemia    Kidney failure    Osteoarthritis      No Known Allergies   Current Outpatient Medications  Medication Sig Dispense Refill   finasteride (PROSCAR) 5 MG tablet Take 5 mg by mouth daily.     imatinib (GLEEVEC) 400 MG tablet Take 400 mg by mouth daily. Take with meals and large glass of water.Caution:Chemotherapy.     multivitamin-lutein (OCUVITE-LUTEIN) CAPS capsule Take 1 capsule by mouth daily.     simvastatin (ZOCOR) 40 MG tablet Take 40 mg by mouth daily.     tamsulosin (FLOMAX) 0.4 MG CAPS capsule Take 0.4 mg by mouth daily.      No current facility-administered medications for this visit.     Past Surgical History:  Procedure Laterality Date   APPENDECTOMY     CHOLECYSTECTOMY     LAMINECTOMY       No Known Allergies    Family History  Problem Relation Age of Onset   Kidney failure Mother    Heart attack Father    Heart attack Sister    Prostate cancer Brother      Social History Kenneth [name] reports that he has never smoked. He has never used smokeless tobacco. Kenneth [name] reports no history of alcohol use.   Review of Systems CONSTITUTIONAL: No weight loss, fever, chills, weakness or fatigue.  HEENT: Eyes: No visual  loss, blurred vision, double vision or yellow sclerae.No hearing loss, sneezing, congestion, runny nose or sore throat.  SKIN: No rash or itching.  CARDIOVASCULAR: per hpi RESPIRATORY: No shortness of breath, cough or sputum.  GASTROINTESTINAL: No anorexia, nausea, vomiting or diarrhea. No abdominal pain or blood.  GENITOURINARY: No burning on urination, no polyuria NEUROLOGICAL: No headache, dizziness, syncope, paralysis, ataxia, numbness or tingling in the extremities. No change in bowel or bladder control.  MUSCULOSKELETAL: No muscle, back pain, joint pain or stiffness.  LYMPHATICS: No enlarged nodes. No history of splenectomy.  PSYCHIATRIC: No history of depression or anxiety.  ENDOCRINOLOGIC: No reports of sweating, cold or heat intolerance. No polyuria or polydipsia.  [name]   Physical Examination Today's Vitals   03/14/21 1022  BP: 132/76  Pulse: 87  SpO2: 98%  Weight: 164 lb 12.8 oz (74.8 kg)  Height: 5\' 8"  (1.727 m)   Body mass index is 25.06 kg/m.  Gen: resting comfortably, no acute distress HEENT: no scleral icterus, pupils equal round and reactive, no palptable cervical adenopathy,  CV: RRR, 3/6 systolic murmur rusb, no jvd. Left sided carotid bruit Resp: Clear to auscultation bilaterally GI: abdomen is soft, non-tender, non-distended, normal bowel sounds, no hepatosplenomegaly MSK: extremities  are warm, no edema.  Skin: warm, no rash Neuro:  no focal deficits Psych: appropriate affect   Diagnostic Studies 09/2017 echo Study Conclusions   - Left ventricle: The cavity size was normal. Wall thickness was   increased in a pattern of mild LVH. Systolic function was normal.   The estimated ejection fraction was in the range of 55% to 60%.   Wall motion was normal; there were no regional wall motion   abnormalities. Doppler parameters are consistent with abnormal   left ventricular relaxation (grade 1 diastolic dysfunction). - Aortic valve: Moderately calcified annulus.  Moderately calcified   leaflets. There was moderate stenosis. There was trivial   regurgitation. Mean gradient (S): 17 mm Hg. Peak gradient (S): 31   mm Hg. VTI ratio of LVOT to aortic valve: 0.34. Valve area (VTI):   1.16 cm^2. Valve area (Vmax): 1.19 cm^2. Valve area (Vmean): 1.17   cm^2. - Mitral valve: Mildly thickened leaflets. There was mild   regurgitation. - Left atrium: The atrium was mildly dilated. - Atrial septum: No defect or patent foramen ovale was identified. - Tricuspid valve: There was mild regurgitation. - Pulmonary arteries: PA peak pressure: 18 mm Hg (S). - Pericardium, extracardiac: A small pericardial effusion was   identified circumferential to the heart.   01/2021 echo IMPRESSIONS     1. Left ventricular ejection fraction, by visual estimation, is 55 to  60%. The left ventricle has normal function. Left ventricular septal wall  thickness was mildly increased. Mildly increased left ventricular  posterior wall thickness. There is mildly  increased left ventricular hypertrophy.   2. Elevated mean left atrial pressure.   3. Left ventricular diastolic Doppler parameters are consistent with  impaired relaxation pattern of LV diastolic filling.   4. Global right ventricle has normal systolic function.The right  ventricular size is normal. No increase in right ventricular wall  thickness.   5. Left atrial size was severely dilated.   6. Right atrial size was mildly dilated.   7. Small pericardial effusion.   8. The pericardial effusion is localized near the right atrium.   9. Mild to moderate mitral annular calcification.  10. Severe aortic valve annular calcification.  11. The mitral valve is abnormal. Mild mitral valve regurgitation. No  evidence of mitral stenosis.  12. The tricuspid valve is normal in structure. Tricuspid valve  regurgitation is trivial.  13. The aortic valve is tricuspid Aortic valve regurgitation is trivial by  color flow Doppler. Mild  to moderate aortic valve stenosis.  14. The pulmonic valve was not well visualized. Pulmonic valve  regurgitation is mild by color flow Doppler.  15. Normal pulmonary artery systolic pressure.  16. Indeterminate PASP, inadequate TR jet.    Assessment and Plan  1. Aortic stenosis - due for repeat echo for surveillance, has been moderate asymptomatic AS - order echo  2. Carotid bruit - order carotid US   3. Hyperlipidemia -continue simvastatin, request labs from pcp  4. PACs - EKG today shows SR with PACs - asymptomatic, conitnue to monitor..   F/u 1 year   [name], M.D.

## 2021-04-26 ENCOUNTER — Ambulatory Visit (HOSPITAL_COMMUNITY): Admission: RE | Admit: 2021-04-26 | Payer: Medicare PPO | Source: Ambulatory Visit

## 2021-04-26 ENCOUNTER — Ambulatory Visit (HOSPITAL_COMMUNITY): Payer: Medicare PPO

## 2021-06-12 DIAGNOSIS — L570 Actinic keratosis: Secondary | ICD-10-CM | POA: Diagnosis not present

## 2021-06-12 DIAGNOSIS — S161XXA Strain of muscle, fascia and tendon at neck level, initial encounter: Secondary | ICD-10-CM | POA: Diagnosis not present

## 2021-06-12 DIAGNOSIS — Z6825 Body mass index (BMI) 25.0-25.9, adult: Secondary | ICD-10-CM | POA: Diagnosis not present

## 2021-06-23 DIAGNOSIS — H353122 Nonexudative age-related macular degeneration, left eye, intermediate dry stage: Secondary | ICD-10-CM | POA: Diagnosis not present

## 2021-07-20 ENCOUNTER — Other Ambulatory Visit: Payer: Self-pay | Admitting: *Deleted

## 2021-07-20 DIAGNOSIS — I350 Nonrheumatic aortic (valve) stenosis: Secondary | ICD-10-CM

## 2021-07-20 DIAGNOSIS — R0989 Other specified symptoms and signs involving the circulatory and respiratory systems: Secondary | ICD-10-CM

## 2021-08-22 ENCOUNTER — Ambulatory Visit (INDEPENDENT_AMBULATORY_CARE_PROVIDER_SITE_OTHER): Payer: Medicare PPO

## 2021-08-22 DIAGNOSIS — I350 Nonrheumatic aortic (valve) stenosis: Secondary | ICD-10-CM

## 2021-08-22 DIAGNOSIS — R0989 Other specified symptoms and signs involving the circulatory and respiratory systems: Secondary | ICD-10-CM | POA: Diagnosis not present

## 2021-08-23 LAB — ECHOCARDIOGRAM COMPLETE
AR max vel: 1.01 cm2
AV Area VTI: 1.1 cm2
AV Area mean vel: 0.95 cm2
AV Mean grad: 27.8 mmHg
AV Peak grad: 43.1 mmHg
Ao pk vel: 3.28 m/s
Area-P 1/2: 2.47 cm2
Calc EF: 59.3 %
S' Lateral: 3.14 cm
Single Plane A2C EF: 58.3 %
Single Plane A4C EF: 61.2 %

## 2021-08-24 ENCOUNTER — Telehealth: Payer: Self-pay | Admitting: *Deleted

## 2021-08-24 NOTE — Telephone Encounter (Signed)
Laurine Blazer, LPN  ?6/72/0947  0:96 PM EDT Back to Top  ?  ?Notified, copy to pcp.   ? ?

## 2021-08-24 NOTE — Telephone Encounter (Signed)
-----   Message from [name], MD sent at 08/23/2021 11:28 AM EDT ----- ?Carotid US shows just mild blcokages, continue to monitor at this time ? ?Zandra Abts MD ?

## 2021-09-01 ENCOUNTER — Telehealth: Payer: Self-pay | Admitting: *Deleted

## 2021-09-01 ENCOUNTER — Encounter: Payer: Self-pay | Admitting: *Deleted

## 2021-09-01 NOTE — Telephone Encounter (Signed)
-----   Message from [name], MD sent at 08/28/2021  9:29 AM EDT ----- ?Carotid US shows just mild plaque, just something to continue to monitor at this time ? ?Zandra Abts MD ?

## 2021-09-01 NOTE — Telephone Encounter (Signed)
Laurine Blazer, LPN  ?07/30/5972  1:63 PM EDT Back to Top  ?  ?Patient notified via letter.  Copy to pcp.    ? ?

## 2021-09-04 ENCOUNTER — Telehealth: Payer: Self-pay

## 2021-09-04 NOTE — Telephone Encounter (Signed)
Patient notified and verbalized understanding. Patient had no questions or concerns at this time.  

## 2021-09-04 NOTE — Telephone Encounter (Signed)
-----   Message from [name], MD sent at 08/28/2021  9:33 AM EDT ----- ?Echo shows aortic valve remains moderately stiffened, just something to continue to monitor at this time, no significant change ? ? ?Zandra Abts MD ?

## 2021-09-07 DIAGNOSIS — D519 Vitamin B12 deficiency anemia, unspecified: Secondary | ICD-10-CM | POA: Diagnosis not present

## 2021-09-07 DIAGNOSIS — Z1329 Encounter for screening for other suspected endocrine disorder: Secondary | ICD-10-CM | POA: Diagnosis not present

## 2021-09-07 DIAGNOSIS — D529 Folate deficiency anemia, unspecified: Secondary | ICD-10-CM | POA: Diagnosis not present

## 2021-09-07 DIAGNOSIS — D649 Anemia, unspecified: Secondary | ICD-10-CM | POA: Diagnosis not present

## 2021-09-07 DIAGNOSIS — E782 Mixed hyperlipidemia: Secondary | ICD-10-CM | POA: Diagnosis not present

## 2021-09-07 DIAGNOSIS — N183 Chronic kidney disease, stage 3 unspecified: Secondary | ICD-10-CM | POA: Diagnosis not present

## 2021-09-07 DIAGNOSIS — K2100 Gastro-esophageal reflux disease with esophagitis, without bleeding: Secondary | ICD-10-CM | POA: Diagnosis not present

## 2021-09-07 DIAGNOSIS — E041 Nontoxic single thyroid nodule: Secondary | ICD-10-CM | POA: Diagnosis not present

## 2021-09-07 DIAGNOSIS — E7849 Other hyperlipidemia: Secondary | ICD-10-CM | POA: Diagnosis not present

## 2021-09-11 DIAGNOSIS — C9211 Chronic myeloid leukemia, BCR/ABL-positive, in remission: Secondary | ICD-10-CM | POA: Diagnosis not present

## 2021-09-11 DIAGNOSIS — E041 Nontoxic single thyroid nodule: Secondary | ICD-10-CM | POA: Diagnosis not present

## 2021-09-11 DIAGNOSIS — E0540 Thyrotoxicosis factitia without thyrotoxic crisis or storm: Secondary | ICD-10-CM | POA: Diagnosis not present

## 2021-09-11 DIAGNOSIS — Z0001 Encounter for general adult medical examination with abnormal findings: Secondary | ICD-10-CM | POA: Diagnosis not present

## 2021-09-11 DIAGNOSIS — H353190 Nonexudative age-related macular degeneration, unspecified eye, stage unspecified: Secondary | ICD-10-CM | POA: Diagnosis not present

## 2021-09-11 DIAGNOSIS — N1832 Chronic kidney disease, stage 3b: Secondary | ICD-10-CM | POA: Diagnosis not present

## 2021-09-11 DIAGNOSIS — I350 Nonrheumatic aortic (valve) stenosis: Secondary | ICD-10-CM | POA: Diagnosis not present

## 2021-09-11 DIAGNOSIS — E7849 Other hyperlipidemia: Secondary | ICD-10-CM | POA: Diagnosis not present

## 2021-10-28 DIAGNOSIS — M25531 Pain in right wrist: Secondary | ICD-10-CM | POA: Diagnosis not present

## 2021-10-28 DIAGNOSIS — W228XXA Striking against or struck by other objects, initial encounter: Secondary | ICD-10-CM | POA: Diagnosis not present

## 2021-10-28 DIAGNOSIS — S62101A Fracture of unspecified carpal bone, right wrist, initial encounter for closed fracture: Secondary | ICD-10-CM | POA: Diagnosis not present

## 2021-10-28 DIAGNOSIS — S62111A Displaced fracture of triquetrum [cuneiform] bone, right wrist, initial encounter for closed fracture: Secondary | ICD-10-CM | POA: Diagnosis not present

## 2021-10-30 DIAGNOSIS — K5900 Constipation, unspecified: Secondary | ICD-10-CM | POA: Diagnosis not present

## 2021-10-30 DIAGNOSIS — Z6824 Body mass index (BMI) 24.0-24.9, adult: Secondary | ICD-10-CM | POA: Diagnosis not present

## 2021-10-30 DIAGNOSIS — R300 Dysuria: Secondary | ICD-10-CM | POA: Diagnosis not present

## 2021-10-31 DIAGNOSIS — M25521 Pain in right elbow: Secondary | ICD-10-CM | POA: Diagnosis not present

## 2021-10-31 DIAGNOSIS — S62114A Nondisplaced fracture of triquetrum [cuneiform] bone, right wrist, initial encounter for closed fracture: Secondary | ICD-10-CM | POA: Diagnosis not present

## 2021-10-31 DIAGNOSIS — M25529 Pain in unspecified elbow: Secondary | ICD-10-CM | POA: Diagnosis not present

## 2021-11-03 DIAGNOSIS — M47812 Spondylosis without myelopathy or radiculopathy, cervical region: Secondary | ICD-10-CM | POA: Diagnosis not present

## 2021-11-03 DIAGNOSIS — M25611 Stiffness of right shoulder, not elsewhere classified: Secondary | ICD-10-CM | POA: Diagnosis not present

## 2021-11-03 DIAGNOSIS — M9901 Segmental and somatic dysfunction of cervical region: Secondary | ICD-10-CM | POA: Diagnosis not present

## 2021-11-03 DIAGNOSIS — S233XXA Sprain of ligaments of thoracic spine, initial encounter: Secondary | ICD-10-CM | POA: Diagnosis not present

## 2021-11-03 DIAGNOSIS — S134XXA Sprain of ligaments of cervical spine, initial encounter: Secondary | ICD-10-CM | POA: Diagnosis not present

## 2021-11-10 DIAGNOSIS — M47812 Spondylosis without myelopathy or radiculopathy, cervical region: Secondary | ICD-10-CM | POA: Diagnosis not present

## 2021-11-10 DIAGNOSIS — S233XXA Sprain of ligaments of thoracic spine, initial encounter: Secondary | ICD-10-CM | POA: Diagnosis not present

## 2021-11-10 DIAGNOSIS — S134XXA Sprain of ligaments of cervical spine, initial encounter: Secondary | ICD-10-CM | POA: Diagnosis not present

## 2021-11-10 DIAGNOSIS — M25611 Stiffness of right shoulder, not elsewhere classified: Secondary | ICD-10-CM | POA: Diagnosis not present

## 2021-11-10 DIAGNOSIS — M9901 Segmental and somatic dysfunction of cervical region: Secondary | ICD-10-CM | POA: Diagnosis not present

## 2021-11-13 DIAGNOSIS — S62114A Nondisplaced fracture of triquetrum [cuneiform] bone, right wrist, initial encounter for closed fracture: Secondary | ICD-10-CM | POA: Diagnosis not present

## 2021-12-11 DIAGNOSIS — N1832 Chronic kidney disease, stage 3b: Secondary | ICD-10-CM | POA: Diagnosis not present

## 2021-12-11 DIAGNOSIS — R634 Abnormal weight loss: Secondary | ICD-10-CM | POA: Diagnosis not present

## 2021-12-11 DIAGNOSIS — M47812 Spondylosis without myelopathy or radiculopathy, cervical region: Secondary | ICD-10-CM | POA: Diagnosis not present

## 2021-12-11 DIAGNOSIS — C9211 Chronic myeloid leukemia, BCR/ABL-positive, in remission: Secondary | ICD-10-CM | POA: Diagnosis not present

## 2021-12-11 DIAGNOSIS — S161XXA Strain of muscle, fascia and tendon at neck level, initial encounter: Secondary | ICD-10-CM | POA: Diagnosis not present

## 2021-12-11 DIAGNOSIS — Z6824 Body mass index (BMI) 24.0-24.9, adult: Secondary | ICD-10-CM | POA: Diagnosis not present

## 2021-12-20 DIAGNOSIS — M542 Cervicalgia: Secondary | ICD-10-CM | POA: Diagnosis not present

## 2021-12-20 DIAGNOSIS — N1832 Chronic kidney disease, stage 3b: Secondary | ICD-10-CM | POA: Diagnosis not present

## 2021-12-20 DIAGNOSIS — Z6823 Body mass index (BMI) 23.0-23.9, adult: Secondary | ICD-10-CM | POA: Diagnosis not present

## 2021-12-20 DIAGNOSIS — E0590 Thyrotoxicosis, unspecified without thyrotoxic crisis or storm: Secondary | ICD-10-CM | POA: Diagnosis not present

## 2021-12-20 DIAGNOSIS — R634 Abnormal weight loss: Secondary | ICD-10-CM | POA: Diagnosis not present

## 2021-12-25 DIAGNOSIS — H353132 Nonexudative age-related macular degeneration, bilateral, intermediate dry stage: Secondary | ICD-10-CM | POA: Diagnosis not present

## 2022-03-05 DIAGNOSIS — Z23 Encounter for immunization: Secondary | ICD-10-CM | POA: Diagnosis not present

## 2022-03-12 DIAGNOSIS — E782 Mixed hyperlipidemia: Secondary | ICD-10-CM | POA: Diagnosis not present

## 2022-03-12 DIAGNOSIS — E7849 Other hyperlipidemia: Secondary | ICD-10-CM | POA: Diagnosis not present

## 2022-03-12 DIAGNOSIS — E0540 Thyrotoxicosis factitia without thyrotoxic crisis or storm: Secondary | ICD-10-CM | POA: Diagnosis not present

## 2022-03-12 DIAGNOSIS — C9211 Chronic myeloid leukemia, BCR/ABL-positive, in remission: Secondary | ICD-10-CM | POA: Diagnosis not present

## 2022-03-12 DIAGNOSIS — D519 Vitamin B12 deficiency anemia, unspecified: Secondary | ICD-10-CM | POA: Diagnosis not present

## 2022-03-12 DIAGNOSIS — E0590 Thyrotoxicosis, unspecified without thyrotoxic crisis or storm: Secondary | ICD-10-CM | POA: Diagnosis not present

## 2022-03-12 DIAGNOSIS — E041 Nontoxic single thyroid nodule: Secondary | ICD-10-CM | POA: Diagnosis not present

## 2022-03-12 DIAGNOSIS — D529 Folate deficiency anemia, unspecified: Secondary | ICD-10-CM | POA: Diagnosis not present

## 2022-03-12 DIAGNOSIS — D649 Anemia, unspecified: Secondary | ICD-10-CM | POA: Diagnosis not present

## 2022-03-12 DIAGNOSIS — N183 Chronic kidney disease, stage 3 unspecified: Secondary | ICD-10-CM | POA: Diagnosis not present

## 2022-03-14 DIAGNOSIS — K2100 Gastro-esophageal reflux disease with esophagitis, without bleeding: Secondary | ICD-10-CM | POA: Diagnosis not present

## 2022-03-14 DIAGNOSIS — I350 Nonrheumatic aortic (valve) stenosis: Secondary | ICD-10-CM | POA: Diagnosis not present

## 2022-03-14 DIAGNOSIS — E0540 Thyrotoxicosis factitia without thyrotoxic crisis or storm: Secondary | ICD-10-CM | POA: Diagnosis not present

## 2022-03-14 DIAGNOSIS — C9211 Chronic myeloid leukemia, BCR/ABL-positive, in remission: Secondary | ICD-10-CM | POA: Diagnosis not present

## 2022-03-14 DIAGNOSIS — E041 Nontoxic single thyroid nodule: Secondary | ICD-10-CM | POA: Diagnosis not present

## 2022-03-14 DIAGNOSIS — H353190 Nonexudative age-related macular degeneration, unspecified eye, stage unspecified: Secondary | ICD-10-CM | POA: Diagnosis not present

## 2022-03-14 DIAGNOSIS — N1832 Chronic kidney disease, stage 3b: Secondary | ICD-10-CM | POA: Diagnosis not present

## 2022-03-14 DIAGNOSIS — M542 Cervicalgia: Secondary | ICD-10-CM | POA: Diagnosis not present

## 2022-03-14 DIAGNOSIS — E7849 Other hyperlipidemia: Secondary | ICD-10-CM | POA: Diagnosis not present

## 2022-06-21 ENCOUNTER — Encounter: Payer: Self-pay | Admitting: Nurse Practitioner

## 2022-06-21 ENCOUNTER — Ambulatory Visit: Payer: Medicare PPO | Attending: Nurse Practitioner | Admitting: Nurse Practitioner

## 2022-06-21 VITALS — BP 128/62 | HR 75 | Ht 67.0 in | Wt 160.0 lb

## 2022-06-21 DIAGNOSIS — Z136 Encounter for screening for cardiovascular disorders: Secondary | ICD-10-CM

## 2022-06-21 DIAGNOSIS — I77819 Aortic ectasia, unspecified site: Secondary | ICD-10-CM | POA: Diagnosis not present

## 2022-06-21 DIAGNOSIS — I6523 Occlusion and stenosis of bilateral carotid arteries: Secondary | ICD-10-CM | POA: Diagnosis not present

## 2022-06-21 DIAGNOSIS — I350 Nonrheumatic aortic (valve) stenosis: Secondary | ICD-10-CM

## 2022-06-21 DIAGNOSIS — E785 Hyperlipidemia, unspecified: Secondary | ICD-10-CM | POA: Diagnosis not present

## 2022-06-21 DIAGNOSIS — I491 Atrial premature depolarization: Secondary | ICD-10-CM | POA: Diagnosis not present

## 2022-06-21 NOTE — Patient Instructions (Signed)
Medication Instructions:  Continue all current medications.   Labwork: none  Testing/Procedures: Your physician has requested that you have an echocardiogram. Echocardiography is a painless test that uses sound waves to create images of your heart. It provides your doctor with information about the size and shape of your heart and how well your heart's chambers and valves are working. This procedure takes approximately one hour. There are no restrictions for this procedure. Please do NOT wear cologne, perfume, aftershave, or lotions (deodorant is allowed). Please arrive 15 minutes prior to your appointment time. Office will contact with results via phone, letter or mychart.    DUE MARCH 2024  Follow-Up: 6 months - Dr. Harl Bowie   Any Other Special Instructions Will Be Listed Below (If Applicable).   If you need a refill on your cardiac medications before your next appointment, please call your pharmacy.

## 2022-06-21 NOTE — Progress Notes (Unsigned)
Cardiology Office Note:    Date:  06/21/2022  ID:  Kenneth Maxim Sr., DOB [birth year], MRN [id number]  PCP:  Caryl Bis, MD   [facility] Providers Cardiologist:  Carlyle Dolly, MD { Click to update primary MD,subspecialty MD or APP then REFRESH:1}    Referring MD: Caryl Bis, MD   CC: Here for follow-up  History of Present Illness:    Kenneth Maxim Sr. is a 89 y.o. male with a hx of the following:   Moderate aortic stenosis Hyperlipidemia History of PACs Aortic dilatation  Patient is a 89 year old male with past medical history as mentioned above.  Last seen by Dr. Carlyle Dolly on March 14, 2021.  Was doing well at the time.  Denied any chest pain, shortness of breath, or dyspnea on exertion.  Was very active doing regular yard work without any symptoms.  Denied any palpitations.  As patient was due for echocardiogram to be repeated, 1 was ordered.  Updated echocardiogram in March 2023 revealed normal EF, grade 1 DD, mild LVH, trivial pericardial effusion was present and was found to be circumferential, moderate aortic stenosis noted with mean gradient measuring 27.8 mmHg, mild dilatation of ascending aorta measuring 37 mm, no other significant valvular abnormalities.  Carotid Doppler was ordered as carotid bruit was noted on exam, carotid Dopplers revealed bilateral 1-39% stenosis.  Follow-up study recommended be repeated in 1 year.  Today he presents for follow-up. He states he is doing well from a cardiac perspective. Denies any chest pain, shortness of breath, palpitations, syncope, presyncope, dizziness, orthopnea, PND, swelling or significant weight changes, acute bleeding, or claudication. Tolerating medications well. Denies any other questions or concerns. Sadly, his wife fell last month and hurt her spine.   SH: Married for 56 years, retired from Therapist, sports    Past Medical History:  Diagnosis Date   Aortic stenosis    Hyperlipidemia     Kidney failure    Osteoarthritis     Past Surgical History:  Procedure Laterality Date   APPENDECTOMY     CHOLECYSTECTOMY     LAMINECTOMY      Current Medications: No outpatient medications have been marked as taking for the 06/21/22 encounter (Appointment) with Finis Bud, NP.     Allergies:   Patient has no known allergies.   Social History   Socioeconomic History   Marital status: Married    Spouse name: Not on file   Number of children: Not on file   Years of education: Not on file   Highest education level: Not on file  Occupational History   Not on file  Tobacco Use   Smoking status: Never   Smokeless tobacco: Never  Vaping Use   Vaping Use: Never used  Substance and Sexual Activity   Alcohol use: Never   Drug use: Never   Sexual activity: Not on file  Other Topics Concern   Not on file  Social History Narrative   Not on file   Social Determinants of Health   Financial Resource Strain: Not on file  Food Insecurity: Not on file  Transportation Needs: Not on file  Physical Activity: Not on file  Stress: Not on file  Social Connections: Not on file     Family History: The patient's ***family history includes Heart attack in his father and sister; Kidney failure in his mother; Prostate cancer in his brother.  ROS:   Please see the history of present illness.    *** All  other systems reviewed and are negative.  EKGs/Labs/Other Studies Reviewed:    The following studies were reviewed today: ***  EKG:  EKG is ordered today.  The ekg ordered today demonstrates normal sinus rhythm, 75 bpm, right BBB, otherwise no acute ischemic changes.  Recent Labs: No results found for requested labs within last 365 days.  Recent Lipid Panel No results found for: "CHOL", "TRIG", "HDL", "CHOLHDL", "VLDL", "LDLCALC", "LDLDIRECT"   Risk Assessment/Calculations:   {Does this patient have ATRIAL FIBRILLATION?:[id number]}  No BP recorded.  {Refresh Note OR  Click here to enter BP  :1}***         Physical Exam:    VS:  There were no vitals taken for this visit.    Wt Readings from Last 3 Encounters:  03/14/21 164 lb 12.8 oz (74.8 kg)  02/17/19 170 lb (77.1 kg)  09/24/17 176 lb (79.8 kg)     GEN: *** Well nourished, well developed in no acute distress HEENT: Normal NECK: No JVD; No carotid bruits LYMPHATICS: No lymphadenopathy CARDIAC: ***RRR, no murmurs, rubs, gallops RESPIRATORY:  Clear to auscultation without rales, wheezing or rhonchi  ABDOMEN: Soft, non-tender, non-distended MUSCULOSKELETAL:  No edema; No deformity  SKIN: Warm and dry NEUROLOGIC:  Alert and oriented x 3 PSYCHIATRIC:  Normal affect   ASSESSMENT:    No diagnosis found. PLAN:    In order of problems listed above:  ***      {Are you ordering a CV Procedure (e.g. stress test, cath, DCCV, TEE, etc)?   Press F2        :[id number]}    Wife fell, [place] [facility], damaged spinel.     Married for 65 years.    Hordiculture.   Medication Adjustments/Labs and Tests Ordered: Current medicines are reviewed at length with the patient today.  Concerns regarding medicines are outlined above.  No orders of the defined types were placed in this encounter.  No orders of the defined types were placed in this encounter.   There are no Patient Instructions on file for this visit.   SignedFinis Bud, NP  06/21/2022 12:37 PM    [place]

## 2022-06-28 DIAGNOSIS — M109 Gout, unspecified: Secondary | ICD-10-CM | POA: Diagnosis not present

## 2022-06-28 DIAGNOSIS — Z6825 Body mass index (BMI) 25.0-25.9, adult: Secondary | ICD-10-CM | POA: Diagnosis not present

## 2022-06-28 DIAGNOSIS — M25571 Pain in right ankle and joints of right foot: Secondary | ICD-10-CM | POA: Diagnosis not present

## 2022-07-01 DIAGNOSIS — M109 Gout, unspecified: Secondary | ICD-10-CM | POA: Diagnosis not present

## 2022-07-01 DIAGNOSIS — M25571 Pain in right ankle and joints of right foot: Secondary | ICD-10-CM | POA: Diagnosis not present

## 2022-07-01 DIAGNOSIS — L03115 Cellulitis of right lower limb: Secondary | ICD-10-CM | POA: Diagnosis not present

## 2022-07-30 ENCOUNTER — Ambulatory Visit: Payer: Medicare PPO | Attending: Nurse Practitioner

## 2022-07-30 DIAGNOSIS — I350 Nonrheumatic aortic (valve) stenosis: Secondary | ICD-10-CM

## 2022-07-30 LAB — ECHOCARDIOGRAM COMPLETE
AR max vel: 0.66 cm2
AV Area VTI: 0.73 cm2
AV Area mean vel: 0.68 cm2
AV Mean grad: 46 mmHg
AV Peak grad: 71.1 mmHg
Ao pk vel: 4.22 m/s
Area-P 1/2: 2.71 cm2
Calc EF: 60.4 %
MV M vel: 7.28 m/s
MV Peak grad: 212 mmHg
P 1/2 time: 1984 msec
Radius: 0.35 cm
S' Lateral: 3.5 cm
Single Plane A2C EF: 58.2 %
Single Plane A4C EF: 60.1 %

## 2022-08-02 ENCOUNTER — Telehealth: Payer: Self-pay | Admitting: Cardiology

## 2022-08-02 NOTE — Telephone Encounter (Signed)
Follow Up:      Patient wants to know if his Echo results are ready from Monday?

## 2022-08-03 NOTE — Telephone Encounter (Signed)
Patient notified and verbalized understanding. Patient scheduled to see provider 3/22 @ 11am in the [place] office. PCP copied.

## 2022-08-03 NOTE — Telephone Encounter (Signed)
-----   Message from Finis Bud, NP sent at 08/03/2022  8:22 AM EST ----- Echocardiogram results reviewed.  Normal heart pumping function, left ventricle is mildly stiff during relaxation phase.  Small pericardial effusion is noted.  No evidence of cardiac tamponade.  His aortic valve is mildly leaky and aortic valve stenosis has progressed from moderate to severe.  I want to bring him back to the office to discuss all of these results with him and discuss his goals of care.  Please arrange an office appointment within the next 1 to 2 weeks if possible.    Thank you very much! Finis Bud, AGNP-C

## 2022-08-14 DIAGNOSIS — H353132 Nonexudative age-related macular degeneration, bilateral, intermediate dry stage: Secondary | ICD-10-CM | POA: Diagnosis not present

## 2022-08-17 ENCOUNTER — Ambulatory Visit: Payer: Medicare PPO | Attending: Nurse Practitioner | Admitting: Nurse Practitioner

## 2022-08-17 ENCOUNTER — Encounter: Payer: Self-pay | Admitting: Nurse Practitioner

## 2022-08-17 VITALS — BP 150/80 | HR 60 | Ht 67.0 in | Wt 163.4 lb

## 2022-08-17 DIAGNOSIS — I491 Atrial premature depolarization: Secondary | ICD-10-CM | POA: Diagnosis not present

## 2022-08-17 DIAGNOSIS — I77819 Aortic ectasia, unspecified site: Secondary | ICD-10-CM

## 2022-08-17 DIAGNOSIS — I6523 Occlusion and stenosis of bilateral carotid arteries: Secondary | ICD-10-CM

## 2022-08-17 DIAGNOSIS — I350 Nonrheumatic aortic (valve) stenosis: Secondary | ICD-10-CM | POA: Diagnosis not present

## 2022-08-17 DIAGNOSIS — E785 Hyperlipidemia, unspecified: Secondary | ICD-10-CM

## 2022-08-17 NOTE — Patient Instructions (Signed)

## 2022-08-17 NOTE — Progress Notes (Unsigned)
Cardiology Office Note:    Date:  08/17/2022  ID:  Kenneth Maxim Sr., DOB 1933/07/10, MRN [id number]  PCP:  Caryl Bis, MD   [facility] Prov[facility] Cardiologist:  Carlyle Dolly, MD     Referring MD: Caryl Bis, MD   CC: Here for follow-up  History of Present Illness:    Kenneth Laton Sr. is a delightful 89 y.o. male with a hx of the following:   Severe aortic stenosis Carotid artery stenosis Hyperlipidemia History of PACs Aortic dilatation   Seen by Dr. Carlyle Dolly on March 14, 2021.  Was doing well at the time.     Updated TTE 07/2021 revealed normal EF, grade 1 DD, mild LVH, trivial pericardial effusion was present and was found to be circumferential, moderate AS noted with mean gradient measuring 27.8 mmHg, mild dilatation of ascending aorta measuring 37 mm, no other significant valvular abnormalities.  Carotid Doppler revealed bilateral 1-39% stenosis.  Follow-up study recommended be repeated in 1 year.   Repeat Echo 07/2022 revealed EF 55-60%, no RWMA, grade 1 DD, small pericardial effusion, severe AS with mean gradient at 46.0 mmHg. Based on these results, was told to follow-up in office to discuss results.   Today he presents for follow-up with family. Doing well from a cardiac perspective. Denies any chest pain, shortness of breath, palpitations, syncope, presyncope, dizziness, orthopnea, PND, swelling or significant weight changes, acute bleeding, or claudication. Tolerating medications well. Denies any other questions or concerns.  SH: Married for 65 years, retired from Therapist, sports. Visits wife regularly in SNF, after wife fell and hurt spine.    Past Medical History:  Diagnosis Date   Aortic stenosis    Hyperlipidemia    Kidney failure    Osteoarthritis     Past Surgical History:  Procedure Laterality Date   APPENDECTOMY     CHOLECYSTECTOMY     LAMINECTOMY       Current Meds  Medication Sig   cholecalciferol (VITAMIN  D3) 25 MCG (1000 UNIT) tablet Take 1,000 Units by mouth daily.   finasteride (PROSCAR) 5 MG tablet Take 5 mg by mouth daily.   imatinib (GLEEVEC) 400 MG tablet Take 400 mg by mouth daily. Take with meals and large glass of water.Caution:Chemotherapy.   Multiple Vitamin (MULTIVITAMIN) capsule Take 1 capsule by mouth daily.   Multiple Vitamins-Minerals (PRESERVISION AREDS PO) Take by mouth.   simvastatin (ZOCOR) 40 MG tablet Take 40 mg by mouth daily.   tamsulosin (FLOMAX) 0.4 MG CAPS capsule Take 0.4 mg by mouth daily.    vitamin B-12 (CYANOCOBALAMIN) 500 MCG tablet Take 500 mcg by mouth daily.   Allergies:   Patient has no known allergies.   Social History   Socioeconomic History   Marital status: Married    Spouse name: Not on file   Number of children: Not on file   Years of education: Not on file   Highest education level: Not on file  Occupational History   Not on file  Tobacco Use   Smoking status: Never   Smokeless tobacco: Never  Vaping Use   Vaping Use: Never used  Substance and Sexual Activity   Alcohol use: Never   Drug use: Never   Sexual activity: Not on file  Other Topics Concern   Not on file  Social History Narrative   Not on file   Social Determinants of Health   Financial Resource Strain: Not on file  Food Insecurity: Not on file  Transportation  Needs: Not on file  Physical Activity: Not on file  Stress: Not on file  Social Connections: Not on file     Family History: The patient's family history includes Heart attack in his father and sister; Kidney failure in his mother; Prostate cancer in his brother.  ROS:     Please see the history of present illness.    All other systems reviewed and are negative.  EKGs/Labs/Other Studies Reviewed:    The following studies were reviewed today:   EKG:  EKG is not ordered today.    Carotid duplex on 08/23/2021: Summary:  Right Carotid: Velocities in the right ICA are consistent with a 1-39%   stenosis.   Left Carotid: Velocities in the left ICA are consistent with a 1-39%  stenosis.   Vertebrals: Bilateral vertebral arteries demonstrate antegrade flow.  Subclavians: Normal flow hemodynamics were seen in bilateral subclavian               arteries.   *See table(s) above for measurements and observations.  Suggest follow up study in 12 months.  Echocardiogram on 08/22/2021: 1. Left ventricular ejection fraction, by estimation, is 55 to 60%. The  left ventricle has normal function. The left ventricle has no regional  wall motion abnormalities. There is mild left ventricular hypertrophy.  Left ventricular diastolic parameters  are consistent with Grade I diastolic dysfunction (impaired relaxation).  The average left ventricular global longitudinal strain is -18.4 %. The  global longitudinal strain is normal.   2. Right ventricular systolic function is normal. The right ventricular  size is normal. Tricuspid regurgitation signal is inadequate for assessing  PA pressure.   3. The pericardial effusion is circumferential.   4. The mitral valve is abnormal. Mild mitral valve regurgitation. No  evidence of mitral stenosis.   5. The aortic valve is tricuspid. There is moderate calcification of the  aortic valve. There is moderate thickening of the aortic valve. Aortic  valve regurgitation is not visualized. Moderate aortic valve stenosis.  Aortic valve mean gradient measures  27.8 mmHg. Aortic valve peak gradient measures 43.1 mmHg. Aortic valve  area, by VTI measures 1.10 cm.   6. Aortic dilatation noted. There is mild dilatation of the ascending  aorta, measuring 37 mm.   7. The inferior vena cava is normal in size with greater than 50%  respiratory variability, suggesting right atrial pressure of 3 mmHg.   Comparison(s): LVEF 55-60%, Mild-Moderate AS.   Recent Labs: No results found for requested labs within last 365 days.  Recent Lipid Panel No results found for:  "CHOL", "TRIG", "HDL", "CHOLHDL", "VLDL", "LDLCALC", "LDLDIRECT"   Physical Exam:    VS:  BP (!) 150/80   Pulse 60   Ht 5\' 7"  (1.702 m)   Wt 163 lb 6.4 oz (74.1 kg)   SpO2 97%   BMI 25.59 kg/m     Wt Readings from Last 3 Encounters:  08/17/22 163 lb 6.4 oz (74.1 kg)  06/21/22 160 lb (72.6 kg)  03/14/21 164 lb 12.8 oz (74.8 kg)     GEN: Well nourished, well developed in no acute distress HEENT: Normal NECK: No JVD; No carotid bruits CARDIAC: S1/S2, RRR, Grade 2/6 systolic murmur, no rubs, no gallops; 2+ pulses RESPIRATORY:  Clear to auscultation without rales, wheezing or rhonchi  ABDOMEN: Soft, non-tender, non-distended MUSCULOSKELETAL:  No edema; No deformity  SKIN: Warm and dry NEUROLOGIC:  Alert and oriented x 3 PSYCHIATRIC:  Normal affect   ASSESSMENT:    1.  Severe aortic stenosis   2. Bilateral carotid artery stenosis   3. Hyperlipidemia, unspecified hyperlipidemia type   4. Premature atrial contractions   5. Aortic dilatation (HCC)     PLAN:    In order of problems listed above:  Severe aortic stenosis Has progressed from 1 year ago. TTE 07/2022 revealed severe aortic valve stenosis with mean gradient measuring 46.0 mmHg.  Patient denies any symptoms. Discussed results with pt. Pt not a surgical candidate d/t age. Continue current medication regimen. Heart healthy diet encouraged.  Goals of care and Care precautions/ED precautions discussed. Recommended Life Alert, he verbalized understanding.   2.  Carotid artery stenosis Bilateral carotid duplex in March 2023 showed mild bilateral ICA stenosis at 1 to 39%.  Previously offered and discussed with patient regarding follow-up, however patient declined arranging.  Continue simvastatin. Consider repeating imaging at next OV. Heart healthy diet recommended.   3. Hyperlipidemia Last lipid panel unremarkable.  Continue simvastatin. Heart healthy diet encouraged.   4. History of PAC's Denies any palpitations or  tachycardia. Heart healthy diet encouraged.   5. Aortic dilatation Borderline dilatation of aortic root, measuring 39 mm. Borderline dilatation of ascending aorta measuring 38 mm. Denies any symptoms. Heart healthy diet encouraged. Recommend repeating Echo in 1 year for monitoring.   6.  Disposition: Follow-up in 6 months with Dr. Carlyle Dolly as scheduled or sooner if anything changes.  Medication Adjustments/Labs and Tests Ordered: Current medicines are reviewed at length with the patient today.  Concerns regarding medicines are outlined above.  No orders of the defined types were placed in this encounter.  No orders of the defined types were placed in this encounter.   Patient Instructions  Medication Instructions:  Your physician recommends that you continue on your current medications as directed. Please refer to the Current Medication list given to you today.   Labwork: none  Testing/Procedures: none  Follow-Up:  Your physician recommends that you schedule a follow-up appointment in: 6 months  Any Other Special Instructions Will Be Listed Below (If Applicable).  If you need a refill on your cardiac medications before your next appointment, please call your pharmacy.    Signed, Finis Bud, NP  08/19/2022 5:58 PM    [place]

## 2022-08-23 DIAGNOSIS — L570 Actinic keratosis: Secondary | ICD-10-CM | POA: Diagnosis not present

## 2022-08-23 DIAGNOSIS — Z6825 Body mass index (BMI) 25.0-25.9, adult: Secondary | ICD-10-CM | POA: Diagnosis not present

## 2022-08-23 DIAGNOSIS — M26621 Arthralgia of right temporomandibular joint: Secondary | ICD-10-CM | POA: Diagnosis not present

## 2022-09-14 DIAGNOSIS — M109 Gout, unspecified: Secondary | ICD-10-CM | POA: Diagnosis not present

## 2022-09-14 DIAGNOSIS — E7849 Other hyperlipidemia: Secondary | ICD-10-CM | POA: Diagnosis not present

## 2022-09-14 DIAGNOSIS — C9211 Chronic myeloid leukemia, BCR/ABL-positive, in remission: Secondary | ICD-10-CM | POA: Diagnosis not present

## 2022-09-14 DIAGNOSIS — E782 Mixed hyperlipidemia: Secondary | ICD-10-CM | POA: Diagnosis not present

## 2022-09-14 DIAGNOSIS — N1832 Chronic kidney disease, stage 3b: Secondary | ICD-10-CM | POA: Diagnosis not present

## 2022-09-14 DIAGNOSIS — E0590 Thyrotoxicosis, unspecified without thyrotoxic crisis or storm: Secondary | ICD-10-CM | POA: Diagnosis not present

## 2022-09-14 DIAGNOSIS — D529 Folate deficiency anemia, unspecified: Secondary | ICD-10-CM | POA: Diagnosis not present

## 2022-09-14 DIAGNOSIS — D519 Vitamin B12 deficiency anemia, unspecified: Secondary | ICD-10-CM | POA: Diagnosis not present

## 2022-09-14 DIAGNOSIS — D649 Anemia, unspecified: Secondary | ICD-10-CM | POA: Diagnosis not present

## 2022-09-14 DIAGNOSIS — K2100 Gastro-esophageal reflux disease with esophagitis, without bleeding: Secondary | ICD-10-CM | POA: Diagnosis not present

## 2022-09-26 DIAGNOSIS — M542 Cervicalgia: Secondary | ICD-10-CM | POA: Diagnosis not present

## 2022-09-26 DIAGNOSIS — I350 Nonrheumatic aortic (valve) stenosis: Secondary | ICD-10-CM | POA: Diagnosis not present

## 2022-09-26 DIAGNOSIS — E0540 Thyrotoxicosis factitia without thyrotoxic crisis or storm: Secondary | ICD-10-CM | POA: Diagnosis not present

## 2022-09-26 DIAGNOSIS — E7849 Other hyperlipidemia: Secondary | ICD-10-CM | POA: Diagnosis not present

## 2022-09-26 DIAGNOSIS — Z0001 Encounter for general adult medical examination with abnormal findings: Secondary | ICD-10-CM | POA: Diagnosis not present

## 2022-09-26 DIAGNOSIS — N1832 Chronic kidney disease, stage 3b: Secondary | ICD-10-CM | POA: Diagnosis not present

## 2022-09-26 DIAGNOSIS — K2100 Gastro-esophageal reflux disease with esophagitis, without bleeding: Secondary | ICD-10-CM | POA: Diagnosis not present

## 2022-09-26 DIAGNOSIS — C9211 Chronic myeloid leukemia, BCR/ABL-positive, in remission: Secondary | ICD-10-CM | POA: Diagnosis not present

## 2022-09-26 DIAGNOSIS — E041 Nontoxic single thyroid nodule: Secondary | ICD-10-CM | POA: Diagnosis not present

## 2022-09-27 DIAGNOSIS — H1132 Conjunctival hemorrhage, left eye: Secondary | ICD-10-CM | POA: Diagnosis not present

## 2022-10-05 DIAGNOSIS — Z131 Encounter for screening for diabetes mellitus: Secondary | ICD-10-CM | POA: Diagnosis not present

## 2022-10-05 DIAGNOSIS — N1832 Chronic kidney disease, stage 3b: Secondary | ICD-10-CM | POA: Diagnosis not present

## 2022-10-05 DIAGNOSIS — D529 Folate deficiency anemia, unspecified: Secondary | ICD-10-CM | POA: Diagnosis not present

## 2023-01-01 ENCOUNTER — Ambulatory Visit: Payer: Medicare PPO | Attending: Cardiology | Admitting: Cardiology

## 2023-01-01 VITALS — BP 128/80 | HR 63 | Ht 68.0 in | Wt 163.4 lb

## 2023-01-01 DIAGNOSIS — I350 Nonrheumatic aortic (valve) stenosis: Secondary | ICD-10-CM | POA: Diagnosis not present

## 2023-01-01 DIAGNOSIS — E782 Mixed hyperlipidemia: Secondary | ICD-10-CM | POA: Diagnosis not present

## 2023-01-01 NOTE — Progress Notes (Signed)
Clinical Summary Mr. Iribe is a 89 y.o.male seen today for follow up of the following medical problems.    1. Aortic stenosis - 07/2016 LVEF 50-55%, grade I diastolic dysfunction, dilated aortic root and ascending aorta. Moderate AS - mean grad 12, AVA VTI 1.04, mod PI    09/2017 echo LVEF 55-60%, moderate AS (AVA VTI 1.16, mean grad 17)      01/2019 echo LVEF 55-60%, grade I dd, severe LAE, mild to mod AS mean gradd 16.6 AVA VTI 1.12 - no recent chest pains, no SOB/DOE, no chest pain.  - does regular yardwork without symptoms.       07/2022 echo: LVEF 55-60%, severe AS AVA VTI 0.73 mean grad 46   -at 07/2022 evaluation by PA Philis Nettle patient was asymptomatic -no chest pains. Rides a stationary bike 10-15 minutes without exertional symptoms     2. Hyperlipidemia - Has been on simva statin for long time.  - his labs are followed by pcp   3. PACs - denies any symptoms   4. CKD IV - followed by pcp      Past Medical History:  Diagnosis Date   Aortic stenosis    Hyperlipidemia    Kidney failure    Osteoarthritis      No Known Allergies   Current Outpatient Medications  Medication Sig Dispense Refill   cholecalciferol (VITAMIN D3) 25 MCG (1000 UNIT) tablet Take 1,000 Units by mouth daily.     finasteride (PROSCAR) 5 MG tablet Take 5 mg by mouth daily.     imatinib (GLEEVEC) 400 MG tablet Take 400 mg by mouth daily. Take with meals and large glass of water.Caution:Chemotherapy.     Multiple Vitamin (MULTIVITAMIN) capsule Take 1 capsule by mouth daily.     Multiple Vitamins-Minerals (PRESERVISION AREDS PO) Take by mouth.     simvastatin (ZOCOR) 40 MG tablet Take 40 mg by mouth daily.     tamsulosin (FLOMAX) 0.4 MG CAPS capsule Take 0.4 mg by mouth daily.      vitamin B-12 (CYANOCOBALAMIN) 500 MCG tablet Take 500 mcg by mouth daily.     No current facility-administered medications for this visit.     Past Surgical History:  Procedure Laterality Date    APPENDECTOMY     CHOLECYSTECTOMY     LAMINECTOMY       No Known Allergies    Family History  Problem Relation Age of Onset   Kidney failure Mother    Heart attack Father    Heart attack Sister    Prostate cancer Brother      Social History Mr. Jeanpierre reports that he has never smoked. He has never used smokeless tobacco. Mr. Mcgonigle reports no history of alcohol use.   Review of Systems CONSTITUTIONAL: No weight loss, fever, chills, weakness or fatigue.  HEENT: Eyes: No visual loss, blurred vision, double vision or yellow sclerae.No hearing loss, sneezing, congestion, runny nose or sore throat.  SKIN: No rash or itching.  CARDIOVASCULAR: per hpi RESPIRATORY: No shortness of breath, cough or sputum.  GASTROINTESTINAL: No anorexia, nausea, vomiting or diarrhea. No abdominal pain or blood.  GENITOURINARY: No burning on urination, no polyuria NEUROLOGICAL: No headache, dizziness, syncope, paralysis, ataxia, numbness or tingling in the extremities. No change in bowel or bladder control.  MUSCULOSKELETAL: No muscle, back pain, joint pain or stiffness.  LYMPHATICS: No enlarged nodes. No history of splenectomy.  PSYCHIATRIC: No history of depression or anxiety.  ENDOCRINOLOGIC: No reports of sweating,  cold or heat intolerance. No polyuria or polydipsia.  [name]   Physical Examination Today's Vitals   01/01/23 1339  BP: 128/80  Pulse: 63  SpO2: 99%  Weight: 163 lb 6.4 oz (74.1 kg)  Height: 5\' 8"  (1.727 m)  PainSc: 0-No pain   Body mass index is 24.84 kg/m.  Gen: resting comfortably, no acute distress HEENT: no scleral icterus, pupils equal round and reactive, no palptable cervical adenopathy,  CV: RRR, 3/6 systolic murmur rusb, no jvd Resp: Clear to auscultation bilaterally GI: abdomen is soft, non-tender, non-distended, normal bowel sounds, no hepatosplenomegaly MSK: extremities are warm, no edema.  Skin: warm, no rash Neuro:  no focal deficits Psych: appropriate  affect   Diagnostic Studies  09/2017 echo Study Conclusions   - Left ventricle: The cavity size was normal. Wall thickness was   increased in a pattern of mild LVH. Systolic function was normal.   The estimated ejection fraction was in the range of 55% to 60%.   Wall motion was normal; there were no regional wall motion   abnormalities. Doppler parameters are consistent with abnormal   left ventricular relaxation (grade 1 diastolic dysfunction). - Aortic valve: Moderately calcified annulus. Moderately calcified   leaflets. There was moderate stenosis. There was trivial   regurgitation. Mean gradient (S): 17 mm Hg. Peak gradient (S): 31   mm Hg. VTI ratio of LVOT to aortic valve: 0.34. Valve area (VTI):   1.16 cm^2. Valve area (Vmax): 1.19 cm^2. Valve area (Vmean): 1.17   cm^2. - Mitral valve: Mildly thickened leaflets. There was mild   regurgitation. - Left atrium: The atrium was mildly dilated. - Atrial septum: No defect or patent foramen ovale was identified. - Tricuspid valve: There was mild regurgitation. - Pulmonary arteries: PA peak pressure: 18 mm Hg (S). - Pericardium, extracardiac: A small pericardial effusion was   identified circumferential to the heart.   01/2021 echo IMPRESSIONS     1. Left ventricular ejection fraction, by visual estimation, is 55 to  60%. The left ventricle has normal function. Left ventricular septal wall  thickness was mildly increased. Mildly increased left ventricular  posterior wall thickness. There is mildly  increased left ventricular hypertrophy.   2. Elevated mean left atrial pressure.   3. Left ventricular diastolic Doppler parameters are consistent with  impaired relaxation pattern of LV diastolic filling.   4. Global right ventricle has normal systolic function.The right  ventricular size is normal. No increase in right ventricular wall  thickness.   5. Left atrial size was severely dilated.   6. Right atrial size was mildly  dilated.   7. Small pericardial effusion.   8. The pericardial effusion is localized near the right atrium.   9. Mild to moderate mitral annular calcification.  10. Severe aortic valve annular calcification.  11. The mitral valve is abnormal. Mild mitral valve regurgitation. No  evidence of mitral stenosis.  12. The tricuspid valve is normal in structure. Tricuspid valve  regurgitation is trivial.  13. The aortic valve is tricuspid Aortic valve regurgitation is trivial by  color flow Doppler. Mild to moderate aortic valve stenosis.  14. The pulmonic valve was not well visualized. Pulmonic valve  regurgitation is mild by color flow Doppler.  15. Normal pulmonary artery systolic pressure.  16. Indeterminate PASP, inadequate TR jet.   07/2022 echo IMPRESSIONS     1. Left ventricular ejection fraction, by estimation, is 55 to 60%. The  left ventricle has normal function. The left ventricle has  no regional  wall motion abnormalities. Left ventricular diastolic parameters are  consistent with Grade I diastolic  dysfunction (impaired relaxation). The average left ventricular global  longitudinal strain is -24.9 %. The global longitudinal strain is normal.   2. Right ventricular systolic function is normal. The right ventricular  size is normal. There is normal pulmonary artery systolic pressure. The  estimated right ventricular systolic pressure is 21.7 mmHg.   3. Left atrial size was moderately dilated.   4. A small pericardial effusion is present. The pericardial effusion is  circumferential.   5. The mitral valve is degenerative. Trivial mitral valve regurgitation.  No evidence of mitral stenosis.   6. The aortic valve is calcified. There is severe calcifcation of the  aortic valve. Aortic valve regurgitation is mild. Severe aortic valve  stenosis. Aortic valve area, by VTI measures 0.73 cm. Aortic valve mean  gradient measures 46.0 mmHg. Aortic  valve Vmax measures 4.22 m/s.   7.  Aortic dilatation noted. There is borderline dilatation of the aortic  root, measuring 39 mm. There is borderline dilatation of the ascending  aorta, measuring 38 mm.   8. The inferior vena cava is normal in size with greater than 50%  respiratory variability, suggesting right atrial pressure of 3 mmHg.    Assessment and Plan  1. Severe aortic stenosis - severe AS by most recent echo - remains asymptomatic - advanced age but remains independent, still drives and takes care of his yard. I think if he became symptomatic would at least warrant evaluation by structural heart clinic to see if could be a canidate. Highly functional and independent despite his age.     2. Hyperlipidemia -request pcp labs  Repeat echo 07/2023, appt shortly after. Asked to call us if symptoms prior to next appt   Antoine Poche, M.D.

## 2023-01-01 NOTE — Patient Instructions (Signed)
Medication Instructions:   Continue all current medications.   Labwork:  none  Testing/Procedures:  Your physician has requested that you have an echocardiogram. Echocardiography is a painless test that uses sound waves to create images of your heart. It provides your doctor with information about the size and shape of your heart and how well your heart's chambers and valves are working. This procedure takes approximately one hour. There are no restrictions for this procedure. Please do NOT wear cologne, perfume, aftershave, or lotions (deodorant is allowed). Please arrive 15 minutes prior to your appointment time. DUE MARCH 2025  Follow-Up:  March 2025  Any Other Special Instructions Will Be Listed Below (If Applicable).   If you need a refill on your cardiac medications before your next appointment, please call your pharmacy.

## 2023-01-09 ENCOUNTER — Ambulatory Visit (INDEPENDENT_AMBULATORY_CARE_PROVIDER_SITE_OTHER): Payer: Medicare PPO | Admitting: Otolaryngology

## 2023-01-09 DIAGNOSIS — H903 Sensorineural hearing loss, bilateral: Secondary | ICD-10-CM | POA: Diagnosis not present

## 2023-02-01 ENCOUNTER — Encounter (HOSPITAL_COMMUNITY): Payer: Self-pay

## 2023-02-01 DIAGNOSIS — I700 Atherosclerosis of aorta: Secondary | ICD-10-CM | POA: Diagnosis not present

## 2023-02-01 DIAGNOSIS — R079 Chest pain, unspecified: Secondary | ICD-10-CM | POA: Diagnosis not present

## 2023-02-01 DIAGNOSIS — E785 Hyperlipidemia, unspecified: Secondary | ICD-10-CM | POA: Diagnosis not present

## 2023-02-01 DIAGNOSIS — Z79899 Other long term (current) drug therapy: Secondary | ICD-10-CM | POA: Diagnosis not present

## 2023-02-14 ENCOUNTER — Encounter: Payer: Self-pay | Admitting: Nurse Practitioner

## 2023-02-14 ENCOUNTER — Ambulatory Visit: Payer: Medicare PPO | Attending: Nurse Practitioner | Admitting: Nurse Practitioner

## 2023-02-14 VITALS — BP 126/74 | HR 64 | Ht 68.0 in | Wt 163.6 lb

## 2023-02-14 DIAGNOSIS — I350 Nonrheumatic aortic (valve) stenosis: Secondary | ICD-10-CM

## 2023-02-14 DIAGNOSIS — I6523 Occlusion and stenosis of bilateral carotid arteries: Secondary | ICD-10-CM | POA: Diagnosis not present

## 2023-02-14 DIAGNOSIS — I491 Atrial premature depolarization: Secondary | ICD-10-CM | POA: Diagnosis not present

## 2023-02-14 DIAGNOSIS — I77819 Aortic ectasia, unspecified site: Secondary | ICD-10-CM | POA: Diagnosis not present

## 2023-02-14 DIAGNOSIS — E785 Hyperlipidemia, unspecified: Secondary | ICD-10-CM | POA: Diagnosis not present

## 2023-02-14 NOTE — Progress Notes (Signed)
Cardiology Office Note:    Date: 02/14/2023 ID:  Kenneth Mace Sr., DOB 25-Jul-1933, MRN [id number] PCP:  Richardean Chimera, MD [facility] Providers Cardiologist:  Dina Rich, MD    Referring MD: Richardean Chimera, MD   CC: Here for hospital follow-up  History of Present Illness:    Kenneth Lohse Sr. is a delightful 89 y.o. male with a PMH of severe aortic valve stenosis, carotid artery stenosis, HLD, hx of PAC's, and aortic dilatation, who presents today for hospital follow-up.   Echo 07/2022 revealed EF 55-60%, no RWMA, grade 1 DD, small pericardial effusion, severe AS with mean gradient at 46.0 mmHg. Based on these results, was told to follow-up in office to discuss results.   08/17/22 - Was doing well from a cardiac perspective.   Saw Dr. Wyline Mood on 01/01/2023. Was doing well at the time. Asymptomatic and highly functional.   ED visit on February 01, 2023 for CP at [facility].  Reported intermittent chest pressure/tightness lasting 45 minutes to an hour, reported some intermittent lightheadedness over the past few weeks.  Patient stated his symptoms were not happening during exertion.  Denied any presyncope or frank syncope.  Dr. Servando Salina of [facility] was consulted, recommended to follow-up with outpatient cardiology, EKG was reassuring.  Today he presents for follow-up with his son.  He states his episode that led him to go to ED was caused by dehydration. Denies any recurrent episodes as he stays well hydrated. Does admit to episodes of "giving out or low energy," also not sure if this is due to the fact that he will be 89 next year. Continues to be active and attends to his garden. Understands his limitations when it comes to his activity level. Denies any chest pain, shortness of breath, palpitations, syncope, presyncope, dizziness, orthopnea, PND, swelling or significant weight changes, acute bleeding, or claudication.  SH: Married for 65 years, retired from Clinical research associate.  Visits wife regularly in SNF, after wife fell and hurt spine.   ROS:    Please see the history of present illness.    All other systems reviewed and are negative.  EKGs/Labs/Other Studies Reviewed:    The following studies were reviewed today:   EKG:  EKG is not ordered today.    Echo 07/2022: 1. Left ventricular ejection fraction, by estimation, is 55 to 60%. The  left ventricle has normal function. The left ventricle has no regional  wall motion abnormalities. Left ventricular diastolic parameters are  consistent with Grade I diastolic  dysfunction (impaired relaxation). The average left ventricular global  longitudinal strain is -24.9 %. The global longitudinal strain is normal.   2. Right ventricular systolic function is normal. The right ventricular  size is normal. There is normal pulmonary artery systolic pressure. The  estimated right ventricular systolic pressure is 21.7 mmHg.   3. Left atrial size was moderately dilated.   4. A small pericardial effusion is present. The pericardial effusion is  circumferential.   5. The mitral valve is degenerative. Trivial mitral valve regurgitation.  No evidence of mitral stenosis.   6. The aortic valve is calcified. There is severe calcifcation of the  aortic valve. Aortic valve regurgitation is mild. Severe aortic valve  stenosis. Aortic valve area, by VTI measures 0.73 cm. Aortic valve mean  gradient measures 46.0 mmHg. Aortic  valve Vmax measures 4.22 m/s.   7. Aortic dilatation noted. There is borderline dilatation of the aortic  root, measuring 39 mm. There is borderline  dilatation of the ascending  aorta, measuring 38 mm.   8. The inferior vena cava is normal in size with greater than 50%  respiratory variability, suggesting right atrial pressure of 3 mmHg.   Comparison(s): Changes from prior study are noted. AS worsened from  moderate in 07/2021 to severe now with V max 4.22 m/s, mean PG 46 mm Hg  and Aortic valve area  0.7 cm2.  Carotid duplex on 08/23/2021: Summary:  Right Carotid: Velocities in the right ICA are consistent with a 1-39%  stenosis.   Left Carotid: Velocities in the left ICA are consistent with a 1-39%  stenosis.   Vertebrals: Bilateral vertebral arteries demonstrate antegrade flow.  Subclavians: Normal flow hemodynamics were seen in bilateral subclavian               arteries.   *See table(s) above for measurements and observations.  Suggest follow up study in 12 months.  Echocardiogram on 08/22/2021: 1. Left ventricular ejection fraction, by estimation, is 55 to 60%. The  left ventricle has normal function. The left ventricle has no regional  wall motion abnormalities. There is mild left ventricular hypertrophy.  Left ventricular diastolic parameters  are consistent with Grade I diastolic dysfunction (impaired relaxation).  The average left ventricular global longitudinal strain is -18.4 %. The  global longitudinal strain is normal.   2. Right ventricular systolic function is normal. The right ventricular  size is normal. Tricuspid regurgitation signal is inadequate for assessing  PA pressure.   3. The pericardial effusion is circumferential.   4. The mitral valve is abnormal. Mild mitral valve regurgitation. No  evidence of mitral stenosis.   5. The aortic valve is tricuspid. There is moderate calcification of the  aortic valve. There is moderate thickening of the aortic valve. Aortic  valve regurgitation is not visualized. Moderate aortic valve stenosis.  Aortic valve mean gradient measures  27.8 mmHg. Aortic valve peak gradient measures 43.1 mmHg. Aortic valve  area, by VTI measures 1.10 cm.   6. Aortic dilatation noted. There is mild dilatation of the ascending  aorta, measuring 37 mm.   7. The inferior vena cava is normal in size with greater than 50%  respiratory variability, suggesting right atrial pressure of 3 mmHg.   Comparison(s): LVEF 55-60%, Mild-Moderate AS.     Physical Exam:    VS:  BP 126/74   Pulse 64   Ht 5\' 8"  (1.727 m)   Wt 163 lb 9.6 oz (74.2 kg)   SpO2 100%   BMI 24.88 kg/m     Wt Readings from Last 3 Encounters:  02/14/23 163 lb 9.6 oz (74.2 kg)  01/01/23 163 lb 6.4 oz (74.1 kg)  08/17/22 163 lb 6.4 oz (74.1 kg)    GEN: Well nourished, well developed in no acute distress HEENT: Normal NECK: No JVD; No carotid bruits CARDIAC: S1/S2, RRR, Grade 3/6 systolic murmur, no rubs, no gallops; 2+ pulses RESPIRATORY:  Clear to auscultation without rales, wheezing or rhonchi  ABDOMEN: Soft, non-tender, non-distended MUSCULOSKELETAL:  No edema; No deformity  SKIN: Warm and dry NEUROLOGIC:  Alert and oriented x 3 PSYCHIATRIC:  Normal affect   ASSESSMENT & PLAN:    In order of problems listed above:  Severe aortic stenosis TTE 07/2022 revealed severe aortic valve stenosis with mean gradient measuring 46.0 mmHg.  Patient admits to occasional low energy, denies any chest pain, syncope, or shortness of breath.  Pt not a surgical candidate d/t age. Continue current medication regimen. Heart healthy  diet encouraged.  Goals of care and Care precautions/ED precautions discussed. Previously recommended Life Alert, he verbalized understanding. Pt states he has Living Will on file. Plan to repeat Echo next year, already scheduled.   2.  Carotid artery stenosis Bilateral carotid duplex in March 2023 showed mild bilateral ICA stenosis at 1 to 39%.  Previously offered and discussed with patient regarding follow-up, however patient declined arranging.  Continue simvastatin. Heart healthy diet recommended.   3. Hyperlipidemia Last lipid panel unremarkable.  Continue simvastatin. Heart healthy diet encouraged.   4. History of PAC's Denies any palpitations or tachycardia. Heart healthy diet encouraged.   5. Aortic dilatation Borderline dilatation of aortic root, measuring 39 mm. Borderline dilatation of ascending aorta measuring 38 mm. Denies any  symptoms. Heart healthy diet encouraged. Will repeat Echo next year for monitoring, already scheduled.  6.  Disposition: Follow-up with Dr. Dina Rich as scheduled or sooner if anything changes.  Medication Adjustments/Labs and Tests Ordered: Current medicines are reviewed at length with the patient today.  Concerns regarding medicines are outlined above.  No orders of the defined types were placed in this encounter.  No orders of the defined types were placed in this encounter.   Patient Instructions  Medication Instructions:  Your physician recommends that you continue on your current medications as directed. Please refer to the Current Medication list given to you today.  Labwork: None  Testing/Procedures: None  Follow-Up: Your physician recommends that you schedule a follow-up appointment in: f/u as scheduled with Dr.Branch   Any Other Special Instructions Will Be Listed Below (If Applicable).  If you need a refill on your cardiac medications before your next appointment, please call your pharmacy.   Signed, Sharlene Dory, NP

## 2023-02-14 NOTE — Patient Instructions (Addendum)
Medication Instructions:  Your physician recommends that you continue on your current medications as directed. Please refer to the Current Medication list given to you today.  Labwork: None  Testing/Procedures: None  Follow-Up: Your physician recommends that you schedule a follow-up appointment in: f/u as scheduled with Dr.Branch   Any Other Special Instructions Will Be Listed Below (If Applicable).  If you need a refill on your cardiac medications before your next appointment, please call your pharmacy.

## 2023-02-18 DIAGNOSIS — H353132 Nonexudative age-related macular degeneration, bilateral, intermediate dry stage: Secondary | ICD-10-CM | POA: Diagnosis not present

## 2023-04-03 DIAGNOSIS — Z131 Encounter for screening for diabetes mellitus: Secondary | ICD-10-CM | POA: Diagnosis not present

## 2023-04-03 DIAGNOSIS — D649 Anemia, unspecified: Secondary | ICD-10-CM | POA: Diagnosis not present

## 2023-04-03 DIAGNOSIS — Z1321 Encounter for screening for nutritional disorder: Secondary | ICD-10-CM | POA: Diagnosis not present

## 2023-04-03 DIAGNOSIS — E7849 Other hyperlipidemia: Secondary | ICD-10-CM | POA: Diagnosis not present

## 2023-04-03 DIAGNOSIS — N1832 Chronic kidney disease, stage 3b: Secondary | ICD-10-CM | POA: Diagnosis not present

## 2023-04-03 DIAGNOSIS — K219 Gastro-esophageal reflux disease without esophagitis: Secondary | ICD-10-CM | POA: Diagnosis not present

## 2023-04-03 DIAGNOSIS — E0590 Thyrotoxicosis, unspecified without thyrotoxic crisis or storm: Secondary | ICD-10-CM | POA: Diagnosis not present

## 2023-04-05 DIAGNOSIS — Z6824 Body mass index (BMI) 24.0-24.9, adult: Secondary | ICD-10-CM | POA: Diagnosis not present

## 2023-04-05 DIAGNOSIS — R0789 Other chest pain: Secondary | ICD-10-CM | POA: Diagnosis not present

## 2023-04-09 DIAGNOSIS — D519 Vitamin B12 deficiency anemia, unspecified: Secondary | ICD-10-CM | POA: Diagnosis not present

## 2023-04-09 DIAGNOSIS — D649 Anemia, unspecified: Secondary | ICD-10-CM | POA: Diagnosis not present

## 2023-04-09 DIAGNOSIS — D529 Folate deficiency anemia, unspecified: Secondary | ICD-10-CM | POA: Diagnosis not present

## 2023-04-10 DIAGNOSIS — Z23 Encounter for immunization: Secondary | ICD-10-CM | POA: Diagnosis not present

## 2023-04-10 DIAGNOSIS — M542 Cervicalgia: Secondary | ICD-10-CM | POA: Diagnosis not present

## 2023-04-10 DIAGNOSIS — E0540 Thyrotoxicosis factitia without thyrotoxic crisis or storm: Secondary | ICD-10-CM | POA: Diagnosis not present

## 2023-04-10 DIAGNOSIS — K2100 Gastro-esophageal reflux disease with esophagitis, without bleeding: Secondary | ICD-10-CM | POA: Diagnosis not present

## 2023-04-10 DIAGNOSIS — Z6824 Body mass index (BMI) 24.0-24.9, adult: Secondary | ICD-10-CM | POA: Diagnosis not present

## 2023-04-10 DIAGNOSIS — C9211 Chronic myeloid leukemia, BCR/ABL-positive, in remission: Secondary | ICD-10-CM | POA: Diagnosis not present

## 2023-04-10 DIAGNOSIS — H353190 Nonexudative age-related macular degeneration, unspecified eye, stage unspecified: Secondary | ICD-10-CM | POA: Diagnosis not present

## 2023-04-10 DIAGNOSIS — I350 Nonrheumatic aortic (valve) stenosis: Secondary | ICD-10-CM | POA: Diagnosis not present

## 2023-04-10 DIAGNOSIS — N1832 Chronic kidney disease, stage 3b: Secondary | ICD-10-CM | POA: Diagnosis not present

## 2023-04-29 DIAGNOSIS — Z6824 Body mass index (BMI) 24.0-24.9, adult: Secondary | ICD-10-CM | POA: Diagnosis not present

## 2023-04-29 DIAGNOSIS — M76891 Other specified enthesopathies of right lower limb, excluding foot: Secondary | ICD-10-CM | POA: Diagnosis not present

## 2023-04-29 DIAGNOSIS — N1832 Chronic kidney disease, stage 3b: Secondary | ICD-10-CM | POA: Diagnosis not present

## 2023-06-05 DIAGNOSIS — C44319 Basal cell carcinoma of skin of other parts of face: Secondary | ICD-10-CM | POA: Diagnosis not present

## 2023-06-05 DIAGNOSIS — L568 Other specified acute skin changes due to ultraviolet radiation: Secondary | ICD-10-CM | POA: Diagnosis not present

## 2023-06-05 DIAGNOSIS — L570 Actinic keratosis: Secondary | ICD-10-CM | POA: Diagnosis not present

## 2023-06-05 DIAGNOSIS — X32XXXA Exposure to sunlight, initial encounter: Secondary | ICD-10-CM | POA: Diagnosis not present

## 2023-06-05 DIAGNOSIS — D485 Neoplasm of uncertain behavior of skin: Secondary | ICD-10-CM | POA: Diagnosis not present

## 2023-06-27 DIAGNOSIS — C44319 Basal cell carcinoma of skin of other parts of face: Secondary | ICD-10-CM | POA: Diagnosis not present

## 2023-07-01 DIAGNOSIS — C44319 Basal cell carcinoma of skin of other parts of face: Secondary | ICD-10-CM | POA: Diagnosis not present

## 2023-07-02 DIAGNOSIS — C44319 Basal cell carcinoma of skin of other parts of face: Secondary | ICD-10-CM | POA: Diagnosis not present

## 2023-07-03 DIAGNOSIS — C44319 Basal cell carcinoma of skin of other parts of face: Secondary | ICD-10-CM | POA: Diagnosis not present

## 2023-07-04 DIAGNOSIS — C44319 Basal cell carcinoma of skin of other parts of face: Secondary | ICD-10-CM | POA: Diagnosis not present

## 2023-07-08 DIAGNOSIS — C44319 Basal cell carcinoma of skin of other parts of face: Secondary | ICD-10-CM | POA: Diagnosis not present

## 2023-07-09 DIAGNOSIS — C44319 Basal cell carcinoma of skin of other parts of face: Secondary | ICD-10-CM | POA: Diagnosis not present

## 2023-07-10 DIAGNOSIS — C44319 Basal cell carcinoma of skin of other parts of face: Secondary | ICD-10-CM | POA: Diagnosis not present

## 2023-07-11 DIAGNOSIS — C44319 Basal cell carcinoma of skin of other parts of face: Secondary | ICD-10-CM | POA: Diagnosis not present

## 2023-07-13 DIAGNOSIS — C44319 Basal cell carcinoma of skin of other parts of face: Secondary | ICD-10-CM | POA: Diagnosis not present

## 2023-07-15 DIAGNOSIS — C44319 Basal cell carcinoma of skin of other parts of face: Secondary | ICD-10-CM | POA: Diagnosis not present

## 2023-07-16 DIAGNOSIS — C44319 Basal cell carcinoma of skin of other parts of face: Secondary | ICD-10-CM | POA: Diagnosis not present

## 2023-07-17 DIAGNOSIS — C44319 Basal cell carcinoma of skin of other parts of face: Secondary | ICD-10-CM | POA: Diagnosis not present

## 2023-07-22 DIAGNOSIS — C44319 Basal cell carcinoma of skin of other parts of face: Secondary | ICD-10-CM | POA: Diagnosis not present

## 2023-07-23 DIAGNOSIS — C44319 Basal cell carcinoma of skin of other parts of face: Secondary | ICD-10-CM | POA: Diagnosis not present

## 2023-07-24 DIAGNOSIS — C44319 Basal cell carcinoma of skin of other parts of face: Secondary | ICD-10-CM | POA: Diagnosis not present

## 2023-07-25 ENCOUNTER — Telehealth: Payer: Self-pay | Admitting: Cardiology

## 2023-07-25 DIAGNOSIS — C44319 Basal cell carcinoma of skin of other parts of face: Secondary | ICD-10-CM | POA: Diagnosis not present

## 2023-07-25 NOTE — Telephone Encounter (Signed)
 Patient's daughter called and said that patient has radiation on Monday-Thursday and just wanted to make sure it does not interfere with like the patient's echo on 3/3

## 2023-07-25 NOTE — Telephone Encounter (Signed)
 Spoke to Dr. Wyline Mood in regards to radiation and pt's echo. Advised by provider that radiation would not affect pt's echocardiogram.   Spoke to daughter and relayed information. Daughter thankful for the call. Daughter had no questions or concerns at this time.

## 2023-07-27 DIAGNOSIS — C44319 Basal cell carcinoma of skin of other parts of face: Secondary | ICD-10-CM | POA: Diagnosis not present

## 2023-07-29 ENCOUNTER — Other Ambulatory Visit: Payer: Medicare PPO

## 2023-07-29 DIAGNOSIS — C44319 Basal cell carcinoma of skin of other parts of face: Secondary | ICD-10-CM | POA: Diagnosis not present

## 2023-07-30 DIAGNOSIS — C44319 Basal cell carcinoma of skin of other parts of face: Secondary | ICD-10-CM | POA: Diagnosis not present

## 2023-07-31 DIAGNOSIS — C44319 Basal cell carcinoma of skin of other parts of face: Secondary | ICD-10-CM | POA: Diagnosis not present

## 2023-08-01 ENCOUNTER — Ambulatory Visit: Payer: Medicare PPO | Admitting: Cardiology

## 2023-08-01 DIAGNOSIS — C44319 Basal cell carcinoma of skin of other parts of face: Secondary | ICD-10-CM | POA: Diagnosis not present

## 2023-08-02 ENCOUNTER — Ambulatory Visit: Attending: Cardiology | Admitting: Cardiology

## 2023-08-02 ENCOUNTER — Encounter: Payer: Self-pay | Admitting: Cardiology

## 2023-08-02 VITALS — BP 140/78 | HR 69 | Ht 67.0 in | Wt 161.0 lb

## 2023-08-02 DIAGNOSIS — R030 Elevated blood-pressure reading, without diagnosis of hypertension: Secondary | ICD-10-CM

## 2023-08-02 DIAGNOSIS — I350 Nonrheumatic aortic (valve) stenosis: Secondary | ICD-10-CM

## 2023-08-02 DIAGNOSIS — E782 Mixed hyperlipidemia: Secondary | ICD-10-CM

## 2023-08-02 DIAGNOSIS — I491 Atrial premature depolarization: Secondary | ICD-10-CM | POA: Diagnosis not present

## 2023-08-02 NOTE — Patient Instructions (Signed)
 Medication Instructions:  Your physician recommends that you continue on your current medications as directed. Please refer to the Current Medication list given to you today.  *If you need a refill on your cardiac medications before your next appointment, please call your pharmacy*   Lab Work: None  If you have labs (blood work) drawn today and your tests are completely normal, you will receive your results only by: MyChart Message (if you have MyChart) OR A paper copy in the mail If you have any lab test that is abnormal or we need to change your treatment, we will call you to review the results.   Testing/Procedures: None   Follow-Up: At [facility], you and your health needs are our priority.  As part of our continuing mission to provide you with exceptional heart care, we have created designated Provider Care Teams.  These Care Teams include your primary Cardiologist (physician) and Advanced Practice Providers (APPs -  Physician Assistants and Nurse Practitioners) who all work together to provide you with the care you need, when you need it.  We recommend signing up for the patient portal called "MyChart".  Sign up information is provided on this After Visit Summary.  MyChart is used to connect with patients for Virtual Visits (Telemedicine).  Patients are able to view lab/test results, encounter notes, upcoming appointments, etc.  Non-urgent messages can be sent to your provider as well.   To learn more about what you can do with MyChart, go to [email or web address].    Your next appointment:   6 month(s)  Provider:   You may see Dina Rich, MD or one of the following Advanced Practice Providers on your designated Care Team:   Sharlene Dory, NP  Other Instructions Please bring blood pressure log with you to your nurse visit on 3/17 after your echocardiogram.

## 2023-08-02 NOTE — Progress Notes (Signed)
 Clinical Summary Kenneth [name] is a 89 y.o.male seen today for follow up of the following medical problems.    1. Aortic stenosis - 07/2016 LVEF 50-55%, grade I diastolic dysfunction, dilated aortic root and ascending aorta. Moderate AS - mean grad 12, AVA VTI 1.04, mod PI  09/2017 echo LVEF 55-60%, moderate AS (AVA VTI 1.16, mean grad 17)  01/2019 echo LVEF 55-60%, grade I dd, severe LAE, mild to mod AS mean gradd 16.6 AVA VTI 1.12   07/2022 echo: LVEF 55-60%, severe AS AVA VTI 0.73 mean grad 46   - no SOB/DOE, no chest pains - does heavy yardwork, tilling his garden without exertional symptoms. Stationary bicycle 2-3 times for 10 min at comfortable pace.     2. Hyperlipidemia - Has been on simva statin for long time.  - his labs are followed by pcp - request most recent labs from pcp   3. PACs - no recent palpitations.    4. CKD IV - followed by pcp   5. Skin cancer - receiving radiation.   6. Carotid stenosis - 07/2021 carotid US: RICA 1-39%, LICA 1-39%.   Past Medical History:  Diagnosis Date   Aortic stenosis    Hyperlipidemia    Kidney failure    Osteoarthritis      No Known Allergies   Current Outpatient Medications  Medication Sig Dispense Refill   cholecalciferol (VITAMIN D3) 25 MCG (1000 UNIT) tablet Take 1,000 Units by mouth daily.     finasteride (PROSCAR) 5 MG tablet Take 5 mg by mouth daily.     imatinib (GLEEVEC) 400 MG tablet Take 400 mg by mouth daily. Take with meals and large glass of water.Caution:Chemotherapy.     Multiple Vitamin (MULTIVITAMIN) capsule Take 1 capsule by mouth daily.     Multiple Vitamins-Minerals (PRESERVISION AREDS PO) Take by mouth.     simvastatin (ZOCOR) 40 MG tablet Take 40 mg by mouth daily.     tamsulosin (FLOMAX) 0.4 MG CAPS capsule Take 0.4 mg by mouth daily.      vitamin B-12 (CYANOCOBALAMIN) 500 MCG tablet Take 500 mcg by mouth daily.     No current facility-administered medications for this visit.     Past  Surgical History:  Procedure Laterality Date   APPENDECTOMY     CHOLECYSTECTOMY     LAMINECTOMY       No Known Allergies    Family History  Problem Relation Age of Onset   Kidney failure Mother    Heart attack Father    Heart attack Sister    Prostate cancer Brother      Social History Kenneth [name] reports that he has never smoked. He has never used smokeless tobacco. Kenneth [name] reports no history of alcohol use.    Physical Examination Today's Vitals   08/02/23 1149 08/02/23 1234  BP: (!) 152/78 (!) 140/78  Pulse: 69   SpO2: 98%   Weight: 161 lb (73 kg)   Height: 5\' 7"  (1.702 m)    Body mass index is 25.22 kg/m.  Gen: resting comfortably, no acute distress HEENT: no scleral icterus, pupils equal round and reactive, no palptable cervical adenopathy,  CV: RRR, 3/6 systolic murmur rusb, no jvd Resp: Clear to auscultation bilaterally GI: abdomen is soft, non-tender, non-distended, normal bowel sounds, no hepatosplenomegaly MSK: extremities are warm, no edema.  Skin: warm, no rash Neuro:  no focal deficits Psych: appropriate affect   Diagnostic Studies  09/2017 echo Study Conclusions   -  Left ventricle: The cavity size was normal. Wall thickness was   increased in a pattern of mild LVH. Systolic function was normal.   The estimated ejection fraction was in the range of 55% to 60%.   Wall motion was normal; there were no regional wall motion   abnormalities. Doppler parameters are consistent with abnormal   left ventricular relaxation (grade 1 diastolic dysfunction). - Aortic valve: Moderately calcified annulus. Moderately calcified   leaflets. There was moderate stenosis. There was trivial   regurgitation. Mean gradient (S): 17 mm Hg. Peak gradient (S): 31   mm Hg. VTI ratio of LVOT to aortic valve: 0.34. Valve area (VTI):   1.16 cm^2. Valve area (Vmax): 1.19 cm^2. Valve area (Vmean): 1.17   cm^2. - Mitral valve: Mildly thickened leaflets. There was mild    regurgitation. - Left atrium: The atrium was mildly dilated. - Atrial septum: No defect or patent foramen ovale was identified. - Tricuspid valve: There was mild regurgitation. - Pulmonary arteries: PA peak pressure: 18 mm Hg (S). - Pericardium, extracardiac: A small pericardial effusion was   identified circumferential to the heart.   01/2021 echo IMPRESSIONS     1. Left ventricular ejection fraction, by visual estimation, is 55 to  60%. The left ventricle has normal function. Left ventricular septal wall  thickness was mildly increased. Mildly increased left ventricular  posterior wall thickness. There is mildly  increased left ventricular hypertrophy.   2. Elevated mean left atrial pressure.   3. Left ventricular diastolic Doppler parameters are consistent with  impaired relaxation pattern of LV diastolic filling.   4. Global right ventricle has normal systolic function.The right  ventricular size is normal. No increase in right ventricular wall  thickness.   5. Left atrial size was severely dilated.   6. Right atrial size was mildly dilated.   7. Small pericardial effusion.   8. The pericardial effusion is localized near the right atrium.   9. Mild to moderate mitral annular calcification.  10. Severe aortic valve annular calcification.  11. The mitral valve is abnormal. Mild mitral valve regurgitation. No  evidence of mitral stenosis.  12. The tricuspid valve is normal in structure. Tricuspid valve  regurgitation is trivial.  13. The aortic valve is tricuspid Aortic valve regurgitation is trivial by  color flow Doppler. Mild to moderate aortic valve stenosis.  14. The pulmonic valve was not well visualized. Pulmonic valve  regurgitation is mild by color flow Doppler.  15. Normal pulmonary artery systolic pressure.  16. Indeterminate PASP, inadequate TR jet.    07/2022 echo IMPRESSIONS     1. Left ventricular ejection fraction, by estimation, is 55 to 60%. The  left  ventricle has normal function. The left ventricle has no regional  wall motion abnormalities. Left ventricular diastolic parameters are  consistent with Grade I diastolic  dysfunction (impaired relaxation). The average left ventricular global  longitudinal strain is -24.9 %. The global longitudinal strain is normal.   2. Right ventricular systolic function is normal. The right ventricular  size is normal. There is normal pulmonary artery systolic pressure. The  estimated right ventricular systolic pressure is 21.7 mmHg.   3. Left atrial size was moderately dilated.   4. A small pericardial effusion is present. The pericardial effusion is  circumferential.   5. The mitral valve is degenerative. Trivial mitral valve regurgitation.  No evidence of mitral stenosis.   6. The aortic valve is calcified. There is severe calcifcation of the  aortic valve.  Aortic valve regurgitation is mild. Severe aortic valve  stenosis. Aortic valve area, by VTI measures 0.73 cm. Aortic valve mean  gradient measures 46.0 mmHg. Aortic  valve Vmax measures 4.22 m/s.   7. Aortic dilatation noted. There is borderline dilatation of the aortic  root, measuring 39 mm. There is borderline dilatation of the ascending  aorta, measuring 38 mm.   8. The inferior vena cava is normal in size with greater than 50%  respiratory variability, suggesting right atrial pressure of 3 mmHg.       Assessment and Plan   1. Severe aortic stenosis - severe AS by most recent echo - remains asymptomatic - advanced age but remains independent, still drives and takes care of his yard. I think if he became symptomatic would at least warrant evaluation by structural heart clinic to see if could be a canidate. Highly functional and independent despite his age.   - remains asymptomatic, physically active - f/u repeat echo     2. Hyperlipidemia -we will request labs from pcp   3. Elevated blood pressure - repeat bp at upcoming  visit for echo, has not had prior history of HTN  4. PACs - no symptoms - EKG today shows NSR   Antoine Poche, M.D.

## 2023-08-05 DIAGNOSIS — C44319 Basal cell carcinoma of skin of other parts of face: Secondary | ICD-10-CM | POA: Diagnosis not present

## 2023-08-12 ENCOUNTER — Ambulatory Visit: Payer: Medicare PPO | Attending: Cardiology

## 2023-08-12 ENCOUNTER — Ambulatory Visit (INDEPENDENT_AMBULATORY_CARE_PROVIDER_SITE_OTHER)

## 2023-08-12 ENCOUNTER — Ambulatory Visit

## 2023-08-12 ENCOUNTER — Encounter: Payer: Self-pay | Admitting: Nurse Practitioner

## 2023-08-12 VITALS — BP 142/62 | HR 66

## 2023-08-12 DIAGNOSIS — R030 Elevated blood-pressure reading, without diagnosis of hypertension: Secondary | ICD-10-CM | POA: Diagnosis not present

## 2023-08-12 DIAGNOSIS — I350 Nonrheumatic aortic (valve) stenosis: Secondary | ICD-10-CM

## 2023-08-12 LAB — ECHOCARDIOGRAM COMPLETE
AR max vel: 0.67 cm2
AV Area VTI: 0.91 cm2
AV Area mean vel: 0.73 cm2
AV Mean grad: 51 mmHg
AV Peak grad: 76.6 mmHg
AV Vena cont: 0.6 cm
Ao pk vel: 4.38 m/s
Area-P 1/2: 2.13 cm2
Calc EF: 50.1 %
Est EF: 50
MV VTI: 1.95 cm2
P 1/2 time: 292 ms
S' Lateral: 3.5 cm
Single Plane A2C EF: 49.4 %
Single Plane A4C EF: 53 %

## 2023-08-12 NOTE — Progress Notes (Signed)
 Patient in for BP. Brought log in with him had scanned into chart. Takes medication at night. Reports no SOB, Chest Pain or dizziness. BP today 142/62. Sending over to provider.

## 2023-08-14 ENCOUNTER — Telehealth: Payer: Self-pay

## 2023-08-14 NOTE — Progress Notes (Signed)
 BP elevated in clinic but home numbers look fine, no changes  Dominga Ferry MD

## 2023-08-14 NOTE — Telephone Encounter (Signed)
-----   Message from Dina Rich sent at 08/14/2023  1:05 PM EDT -----    ----- Message ----- From: Carmelina Paddock, CMA Sent: 08/12/2023   3:18 PM EDT To: Antoine Poche, MD

## 2023-08-14 NOTE — Telephone Encounter (Signed)
 BP elevated in clinic but home numbers look fine, no changes   Dominga Ferry MD  Advised daughter of Dr. Wyline Mood response. Verbalized understanding

## 2023-08-19 DIAGNOSIS — H353112 Nonexudative age-related macular degeneration, right eye, intermediate dry stage: Secondary | ICD-10-CM | POA: Diagnosis not present

## 2023-08-20 ENCOUNTER — Telehealth: Payer: Self-pay | Admitting: Cardiology

## 2023-08-20 NOTE — Telephone Encounter (Signed)
Patient called to follow-up on test results. 

## 2023-08-21 ENCOUNTER — Telehealth: Payer: Self-pay

## 2023-08-21 DIAGNOSIS — I350 Nonrheumatic aortic (valve) stenosis: Secondary | ICD-10-CM

## 2023-08-21 NOTE — Telephone Encounter (Signed)
 Pt asked if we could call [phone] when we get the results

## 2023-08-21 NOTE — Telephone Encounter (Signed)
 The patient has been notified of the result and verbalized understanding.  All questions (if any) were answered. Roseanne Reno, [facility] 08/21/2023 9:54 AM

## 2023-08-21 NOTE — Telephone Encounter (Signed)
 The patient has been notified of the result and verbalized understanding.  All questions (if any) were answered. Roseanne Reno, CMA 08/21/2023 9:55 AM

## 2023-08-21 NOTE — Telephone Encounter (Signed)
-----   Message from Dina Rich sent at 08/21/2023  9:44 AM EDT ----- Aortic valve remains severely stiffened (severe aortic stenosis). Heart pumping function remains normal. In the absence of symptoms and with normal heart pumping function not an indication to plan for valve replacement at this time but we will monitor closely. Needs repeat echo in 6 months for aortic stenosis.   J BrancH MD

## 2023-09-30 DIAGNOSIS — C44319 Basal cell carcinoma of skin of other parts of face: Secondary | ICD-10-CM | POA: Diagnosis not present

## 2023-10-15 DIAGNOSIS — Z131 Encounter for screening for diabetes mellitus: Secondary | ICD-10-CM | POA: Diagnosis not present

## 2023-10-15 DIAGNOSIS — E782 Mixed hyperlipidemia: Secondary | ICD-10-CM | POA: Diagnosis not present

## 2023-10-15 DIAGNOSIS — Z1321 Encounter for screening for nutritional disorder: Secondary | ICD-10-CM | POA: Diagnosis not present

## 2023-10-15 DIAGNOSIS — Z1329 Encounter for screening for other suspected endocrine disorder: Secondary | ICD-10-CM | POA: Diagnosis not present

## 2023-10-15 DIAGNOSIS — E7849 Other hyperlipidemia: Secondary | ICD-10-CM | POA: Diagnosis not present

## 2023-10-15 DIAGNOSIS — R5382 Chronic fatigue, unspecified: Secondary | ICD-10-CM | POA: Diagnosis not present

## 2023-10-15 DIAGNOSIS — R300 Dysuria: Secondary | ICD-10-CM | POA: Diagnosis not present

## 2023-10-15 DIAGNOSIS — K219 Gastro-esophageal reflux disease without esophagitis: Secondary | ICD-10-CM | POA: Diagnosis not present

## 2023-10-18 DIAGNOSIS — C9211 Chronic myeloid leukemia, BCR/ABL-positive, in remission: Secondary | ICD-10-CM | POA: Diagnosis not present

## 2023-10-18 DIAGNOSIS — M353 Polymyalgia rheumatica: Secondary | ICD-10-CM | POA: Diagnosis not present

## 2023-10-18 DIAGNOSIS — Z1331 Encounter for screening for depression: Secondary | ICD-10-CM | POA: Diagnosis not present

## 2023-10-18 DIAGNOSIS — K2100 Gastro-esophageal reflux disease with esophagitis, without bleeding: Secondary | ICD-10-CM | POA: Diagnosis not present

## 2023-10-18 DIAGNOSIS — Z6824 Body mass index (BMI) 24.0-24.9, adult: Secondary | ICD-10-CM | POA: Diagnosis not present

## 2023-10-18 DIAGNOSIS — E782 Mixed hyperlipidemia: Secondary | ICD-10-CM | POA: Diagnosis not present

## 2023-10-18 DIAGNOSIS — Z0001 Encounter for general adult medical examination with abnormal findings: Secondary | ICD-10-CM | POA: Diagnosis not present

## 2023-10-18 DIAGNOSIS — E7849 Other hyperlipidemia: Secondary | ICD-10-CM | POA: Diagnosis not present

## 2023-10-18 DIAGNOSIS — Z1389 Encounter for screening for other disorder: Secondary | ICD-10-CM | POA: Diagnosis not present

## 2024-01-09 ENCOUNTER — Ambulatory Visit (INDEPENDENT_AMBULATORY_CARE_PROVIDER_SITE_OTHER): Payer: Medicare PPO | Admitting: Otolaryngology

## 2024-01-13 ENCOUNTER — Encounter (INDEPENDENT_AMBULATORY_CARE_PROVIDER_SITE_OTHER): Payer: Self-pay | Admitting: Otolaryngology

## 2024-01-13 ENCOUNTER — Ambulatory Visit (INDEPENDENT_AMBULATORY_CARE_PROVIDER_SITE_OTHER): Admitting: Otolaryngology

## 2024-01-13 VITALS — BP 137/75 | HR 70

## 2024-01-13 DIAGNOSIS — H903 Sensorineural hearing loss, bilateral: Secondary | ICD-10-CM | POA: Diagnosis not present

## 2024-01-13 DIAGNOSIS — H6122 Impacted cerumen, left ear: Secondary | ICD-10-CM

## 2024-01-14 DIAGNOSIS — H903 Sensorineural hearing loss, bilateral: Secondary | ICD-10-CM | POA: Insufficient documentation

## 2024-01-14 DIAGNOSIS — H6122 Impacted cerumen, left ear: Secondary | ICD-10-CM | POA: Insufficient documentation

## 2024-01-14 NOTE — Progress Notes (Signed)
 Patient ID: Kenneth Zaida Roys Sr., male   DOB: [birth year], 89 y.o.   MRN: [id number]  Follow-up: Hearing loss  HPI: The patient is an 89 year old male who returns today for his follow-up evaluation.  He was last seen 1 year ago.  At that time, he was noted to have bilateral high-frequency sensorineural hearing loss, slightly worse on the right side.  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  The patient returns today complaining of bilateral progressive hearing loss.  He currently wears a left ear hearing aid.  He denies any otalgia, otorrhea, or vertigo.  Exam: General: Communicates without difficulty, well nourished, no acute distress. Head: Normocephalic, no evidence injury, no tenderness, facial buttresses intact without stepoff. Face/sinus: No tenderness to palpation and percussion. Facial movement is normal and symmetric. Eyes: PERRL, EOMI. No scleral icterus, conjunctivae clear. Neuro: CN II exam reveals vision grossly intact.  No nystagmus at any point of gaze. Ears: Auricles well formed without lesions.  Left ear cerumen impaction.  The right ear canal and tympanic membrane are normal.  Nose: External evaluation reveals normal support and skin without lesions.  Dorsum is intact.  Anterior rhinoscopy reveals congested mucosa over anterior aspect of inferior turbinates and intact septum.  No purulence noted. Oral:  Oral cavity and oropharynx are intact, symmetric, without erythema or edema.  Mucosa is moist without lesions. Neck: Full range of motion without pain.  There is no significant lymphadenopathy.  No masses palpable.  Thyroid  bed within normal limits to palpation.  Parotid glands and submandibular glands equal bilaterally without mass.  Trachea is midline. Neuro:  CN 2-12 grossly intact.   Procedure: Left ear cerumen disimpaction Anesthesia: None Description: Under the operating microscope, the cerumen is carefully removed with a combination of  cerumen currette, alligator forceps, and suction catheters.  After the cerumen is removed, the TMs are noted to be normal.  No mass, erythema, or lesions. The patient tolerated the procedure well.    Assessment: 1.  Bilateral progressive sensorineural hearing loss, worse on the right side. 2.  Left ear cerumen impaction.  After the cerumen disimpaction procedure, both tympanic membranes and middle ear spaces are noted to be normal. 3.  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   Plan: 1. The physical exam findings are reviewed with the patient. 2. Continue with his left ear hearing aid.  3. Outpatient hearing test.  No audiologist is available today. 4. The patient will return for reevaluation in approximately 12 months.

## 2024-02-12 DIAGNOSIS — R5382 Chronic fatigue, unspecified: Secondary | ICD-10-CM | POA: Diagnosis not present

## 2024-02-12 DIAGNOSIS — D649 Anemia, unspecified: Secondary | ICD-10-CM | POA: Diagnosis not present

## 2024-02-12 DIAGNOSIS — Z1329 Encounter for screening for other suspected endocrine disorder: Secondary | ICD-10-CM | POA: Diagnosis not present

## 2024-02-12 DIAGNOSIS — N1832 Chronic kidney disease, stage 3b: Secondary | ICD-10-CM | POA: Diagnosis not present

## 2024-02-12 DIAGNOSIS — E7849 Other hyperlipidemia: Secondary | ICD-10-CM | POA: Diagnosis not present

## 2024-02-12 DIAGNOSIS — Z0001 Encounter for general adult medical examination with abnormal findings: Secondary | ICD-10-CM | POA: Diagnosis not present

## 2024-02-14 ENCOUNTER — Ambulatory Visit (INDEPENDENT_AMBULATORY_CARE_PROVIDER_SITE_OTHER): Admitting: Audiology

## 2024-02-14 DIAGNOSIS — H903 Sensorineural hearing loss, bilateral: Secondary | ICD-10-CM

## 2024-02-14 NOTE — Progress Notes (Signed)
  [address] [place], [place] [address] [phone]  Audiological Evaluation    Name: Kenneth Mogel Sr.     DOB:   09/22/1933      MRN:   [id number]                                                                                     Service Date: 02/14/2024     Accompanied by: Kenneth [name]   Patient comes today after Dr. Karis, ENT sent a referral for a hearing evaluation due to concerns with hearing loss.   Symptoms Yes Details  Hearing loss  [x]  Known hearing asymmetry , worse in the right ear, Reports difficulty understanding even with left aid in  Tinnitus  []    Ear pain/ infections/pressure  []    Balance problems  []    Noise exposure history  [x]  [employer]  Previous ear surgeries  []    Family history of hearing loss  []    Amplification  [x]  Left aid- says does not like how it sounds Phonak Audeo [id number] - fit at [facility]  Other  []      Otoscopy: Right ear: Clear external ear canal and notable landmarks visualized on the tympanic membrane. Left ear:  Clear external ear canal and notable landmarks visualized on the tympanic membrane.  Tympanometry: Right ear: Type A- Normal external ear canal volume with normal middle ear pressure and tympanic membrane compliance. Left ear: Type A- Normal external ear canal volume with normal middle ear pressure and tympanic membrane compliance.  Pure tone Audiometry: Right ear- Moderate to profound sensorineural hearing loss from 125 Hz - 8000 Hz. Left ear-  Normal to profound sensorineural hearing loss from 125 Hz - 8000 Hz.  Speech Audiometry: Right ear- Speech Awareness Threshold (SAT) was observed at 50 dBHL, with contralateral masking. Left ear-Speech Reception Threshold (SRT) was obtained at 45 dBHL.   Word Recognition Score Tested using NU-6 (recorded) Right ear: 0% was obtained at a presentation level of 90 dBHL with contralateral masking which is deemed as  poor. Left ear: 72% was obtained at a presentation  level of 85 dBHL with contralateral masking which is deemed as  fair.   The hearing test results were completed under headphones and results are deemed to be of good reliability. Test technique:  conventional    Impression: The significant difference in pure-tone thresholds between ears continues to be observed, worse in the right ear. No significant hearing decline noted when today's test was compared to audiogram from 01-12-2023.    Recommendations: Follow up with ENT as scheduled for today. Return for a hearing evaluation if concerns with hearing changes arise or per MD recommendation. Recommend left hearing aid check (potential need for a new receiver wire for around $150 and reprogramming fees (REM) around $60) or consider getting a BiCROS hearing aid system.   Jaana Brodt MARIE LEROUX-MARTINEZ, AUD

## 2024-02-21 ENCOUNTER — Ambulatory Visit (HOSPITAL_COMMUNITY)
Admission: RE | Admit: 2024-02-21 | Discharge: 2024-02-21 | Disposition: A | Discharge status: Home / Self Care | Source: Ambulatory Visit | Attending: Cardiology | Admitting: Cardiology

## 2024-02-21 ENCOUNTER — Ambulatory Visit: Payer: Self-pay | Admitting: Cardiology

## 2024-02-21 DIAGNOSIS — I350 Nonrheumatic aortic (valve) stenosis: Secondary | ICD-10-CM | POA: Diagnosis not present

## 2024-02-21 DIAGNOSIS — E7849 Other hyperlipidemia: Secondary | ICD-10-CM | POA: Diagnosis not present

## 2024-02-21 DIAGNOSIS — Z6824 Body mass index (BMI) 24.0-24.9, adult: Secondary | ICD-10-CM | POA: Diagnosis not present

## 2024-02-21 DIAGNOSIS — E782 Mixed hyperlipidemia: Secondary | ICD-10-CM | POA: Diagnosis not present

## 2024-02-21 DIAGNOSIS — M353 Polymyalgia rheumatica: Secondary | ICD-10-CM | POA: Diagnosis not present

## 2024-02-21 DIAGNOSIS — Z23 Encounter for immunization: Secondary | ICD-10-CM | POA: Diagnosis not present

## 2024-02-21 DIAGNOSIS — K2100 Gastro-esophageal reflux disease with esophagitis, without bleeding: Secondary | ICD-10-CM | POA: Diagnosis not present

## 2024-02-21 DIAGNOSIS — C9211 Chronic myeloid leukemia, BCR/ABL-positive, in remission: Secondary | ICD-10-CM | POA: Diagnosis not present

## 2024-02-21 LAB — ECHOCARDIOGRAM COMPLETE
AR max vel: 0.84 cm2
AV Area VTI: 0.86 cm2
AV Area mean vel: 0.8 cm2
AV Mean grad: 34 mmHg
AV Peak grad: 55 mmHg
Ao pk vel: 3.71 m/s
Area-P 1/2: 2.5 cm2
Calc EF: 42 %
MV M vel: 5.31 m/s
MV Peak grad: 112.8 mmHg
P 1/2 time: 373 ms
S' Lateral: 4 cm
Single Plane A2C EF: 41.1 %
Single Plane A4C EF: 48 %

## 2024-02-21 NOTE — Progress Notes (Signed)
  Echocardiogram 2D Echocardiogram has been performed.  Koleen KANDICE Popper, RDCS 02/21/2024, 10:19 AM

## 2024-02-21 NOTE — Telephone Encounter (Signed)
 Patient was returning call. Please advise ?

## 2024-02-26 DIAGNOSIS — H353122 Nonexudative age-related macular degeneration, left eye, intermediate dry stage: Secondary | ICD-10-CM | POA: Diagnosis not present

## 2024-02-27 NOTE — Progress Notes (Unsigned)
 Cardiology Office Note    Date:  02/28/2024  ID:  Kenneth Zaida Roys Sr., DOB September 07, 1933, MRN [id number] Cardiologist: Alvan Carrier, MD Cardiology APP:  Johnson Laymon HERO, PA-C { : History of Present Illness:    Kenneth Wilk Sr. is a 89 y.o. 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  He was last examined by Dr. Alvan in 07/2023 and most recent echocardiogram had shown a preserved EF of 55 to 60% but he did have severe aortic stenosis with mean gradient at 46 mmHg. He was still active at baseline and denied any recent chest pain or dyspnea on exertion. Follow-up echocardiogram later that month showed his EF was at 50% and RV function was normal. Aortic stenosis remained in a severe range with mild to moderate aortic valve regurgitation. Repeat echocardiogram was recommended in 6 months and this was obtained on 02/21/2024. This showed that his EF had started to decline to 40 to 45% with global hypokinesis. He did have grade 1 diastolic dysfunction, normal RV function, a small pericardial effusion, mild MR and severe AS with mean gradient 51 mmHg but felt to be lower than expected due to decreased LVEF. Also noted to have mild to moderate AI and mild dilatation of the ascending aorta at 36 mm. Dr. Alvan did recommend reviewing referral to the Structural Heart Clinic.  In talking with the patient and his family members today, he remains very active at baseline. He enjoys doing yard work and has a garden as well. Says that he has a chair that he will sit in to take breaks if needed. He has noticed worsening fatigue over the past few years which he feels is due to age but no specific dyspnea on exertion, chest pain, palpitations, dizziness or presyncope. No recent orthopnea, PND or pitting edema.  Studies Reviewed:   EKG: EKG is not  ordered today.  Echocardiogram: 01/2024 IMPRESSIONS     1. Left ventricular ejection fraction, by estimation, is 40 to 45%. The  left ventricle has mildly decreased function. The left ventricle  demonstrates global hypokinesis. There is mild left ventricular  hypertrophy. Left ventricular diastolic parameters  are consistent with Grade I diastolic dysfunction (impaired relaxation).  Elevated left atrial pressure. The average left ventricular global  longitudinal strain is -12.7 %. The global longitudinal strain is  abnormal.   2. Right ventricular systolic function is normal. The right ventricular  size is normal. Tricuspid regurgitation signal is inadequate for assessing  PA pressure.   3. A small pericardial effusion is present. The pericardial effusion is  circumferential. There is no evidence of cardiac tamponade.   4. The mitral valve is abnormal. Mild mitral valve regurgitation. No  evidence of mitral stenosis.   5. Severe aortic stenosis. Lower than expected mean gradient may be  secondary to decreased LVEF. 08/12/23 echo mean gradient wasd 51 mmHg in  setting of normal LV function. . The aortic valve has an indeterminant  number of cusps. There is severe  calcifcation of the aortic valve. There is severe thickening of the aortic  valve. Aortic valve regurgitation is mild to moderate.   6. Aortic dilatation noted. There is mild dilatation of the ascending  aorta, measuring 36 mm.   7. The inferior vena cava is normal in size with greater than 50%  respiratory variability, suggesting right atrial  pressure of 3 mmHg.    Physical Exam:   VS:  BP 122/62 (BP Location: Right Arm, Cuff Size: Normal)   Pulse 80   Ht 5' 7 (1.702 m)   Wt 160 lb 6.4 oz (72.8 kg)   SpO2 98%   BMI 25.12 kg/m    Wt Readings from Last 3 Encounters:  02/28/24 160 lb 6.4 oz (72.8 kg)  08/02/23 161 lb (73 kg)  02/14/23 163 lb 9.6 oz (74.2 kg)     GEN: Pleasant, elderly male appearing in no acute  distress NECK: No JVD; No carotid bruits CARDIAC: RRR, 3/6 SEM along RUSB.  RESPIRATORY:  Clear to auscultation without rales, wheezing or rhonchi  ABDOMEN: Appears non-distended. No obvious abdominal masses. EXTREMITIES: No clubbing or cyanosis. No pitting edema.  Distal pedal pulses are 2+ bilaterally.   Assessment and Plan:   1. Severe aortic stenosis - Most recent echocardiogram last month showed his EF was at 40 to 45% and aortic stenosis was in a severe range with mean gradient of 51 mmHg and he did have mild to moderate AI.  - He appears euvolemic by examination today. Does report baseline fatigue but no specific dyspnea on exertion, chest pain, dizziness or presyncope. Reviewed options with the patient and his family today and he is interested in pursuing workup for TAVR if a candidate. Will refer to the Structural Heart team for further assessment. Requesting a copy of his recent labs from his PCP as he does have advanced CKD by prior labs.  - While his EF is mildly reduced, will hold off on initiating SGLT2 inhibitor therapy at this time until we can reassess kidney function. While his BP is at 122/62 during today's visit, they report it is typically below this.  2. Bilateral carotid artery stenosis - Carotid Dopplers in 07/2021 showed 1 to 39% stenosis bilaterally. Continue statin therapy.  3. Hyperlipidemia LDL goal <70 - Followed by his PCP. Requesting copy most recent labs as discussed below. Continue current medical therapy for now with Simvastatin 40 mg daily.  4. Stage 3-4 CKD - I am unable to locate recent labs but creatinine was at 2.03 when checked in 01/2023. We have requested most recent labs from his PCP as I am worried that his kidney function might prohibit workup for TAVR including cardiac catheterization and required CT scans. He mentions that he was informed by his PCP that renal function had improved.   Signed, Laymon CHRISTELLA Qua, PA-C

## 2024-02-28 ENCOUNTER — Ambulatory Visit: Attending: Student | Admitting: Student

## 2024-02-28 ENCOUNTER — Encounter: Payer: Self-pay | Admitting: *Deleted

## 2024-02-28 ENCOUNTER — Encounter: Payer: Self-pay | Admitting: Student

## 2024-02-28 VITALS — BP 122/62 | HR 80 | Ht 67.0 in | Wt 160.4 lb

## 2024-02-28 DIAGNOSIS — E785 Hyperlipidemia, unspecified: Secondary | ICD-10-CM

## 2024-02-28 DIAGNOSIS — I350 Nonrheumatic aortic (valve) stenosis: Secondary | ICD-10-CM

## 2024-02-28 DIAGNOSIS — N184 Chronic kidney disease, stage 4 (severe): Secondary | ICD-10-CM

## 2024-02-28 DIAGNOSIS — I6523 Occlusion and stenosis of bilateral carotid arteries: Secondary | ICD-10-CM | POA: Diagnosis not present

## 2024-02-28 NOTE — Patient Instructions (Signed)
 Medication Instructions:  Your physician recommends that you continue on your current medications as directed. Please refer to the Current Medication list given to you today.  *If you need a refill on your cardiac medications before your next appointment, please call your pharmacy*  Lab Work: NONE   If you have labs (blood work) drawn today and your tests are completely normal, you will receive your results only by: MyChart Message (if you have MyChart) OR A paper copy in the mail If you have any lab test that is abnormal or we need to change your treatment, we will call you to review the results.  Testing/Procedures: NONE   Follow-Up: At [facility], you and your health needs are our priority.  As part of our continuing mission to provide you with exceptional heart care, our providers are all part of one team.  This team includes your primary Cardiologist (physician) and Advanced Practice Providers or APPs (Physician Assistants and Nurse Practitioners) who all work together to provide you with the care you need, when you need it.  Your next appointment:   3-4 month(s)  Provider:   You may see Alvan Carrier, MD or one of the following Advanced Practice Providers on your designated Care Team:   Laymon Qua, PA-C  Scotesia Maili, [place] Olivia Pavy, [place]     We recommend signing up for the patient portal called MyChart.  Sign up information is provided on this After Visit Summary.  MyChart is used to connect with patients for Virtual Visits (Telemedicine).  Patients are able to view lab/test results, encounter notes, upcoming appointments, etc.  Non-urgent messages can be sent to your provider as well.   To learn more about what you can do with MyChart, go to [email or web address].   Other Instructions Thank you for choosing [facility]!

## 2024-03-30 ENCOUNTER — Encounter: Payer: Self-pay | Admitting: Cardiovascular Disease

## 2024-03-30 ENCOUNTER — Ambulatory Visit: Attending: Cardiovascular Disease | Admitting: Cardiovascular Disease

## 2024-03-30 ENCOUNTER — Other Ambulatory Visit: Payer: Self-pay | Admitting: *Deleted

## 2024-03-30 VITALS — BP 128/0 | HR 67 | Ht 67.0 in | Wt 160.6 lb

## 2024-03-30 DIAGNOSIS — I350 Nonrheumatic aortic (valve) stenosis: Secondary | ICD-10-CM | POA: Diagnosis not present

## 2024-03-30 NOTE — H&P (View-Only) (Signed)
 [facility] Consult Note  Chief Complaint  Patient presents with   New Patient (Initial Visit)    Aortic stenosis   History of Present Illness: 89 yo male with history of CML in remission, carotid artery disease, CKD stage 3, aortic stenosis and HLD who is here today as a new consult, referred by Dr. Alvan, for further discussion regarding his aortic stenosis and possible TAVR. He has been followed for moderate aortic stenosis. Echo in March 2025 with normal LV systolic function and severe aortic stenosis (mean gradient 51 mmHg) with mild to moderate AI. He was followed at that time as he had no symptoms and his LV function was normal. Most recent echo 02/21/24 with LVEF=40-45% with global hypokinesis. Normal RV function. Mild MR. Severe aortic stenosis with mean gradient 34 mmHg (51 mmHg when LV function was normal), AVA 0.8 cm2. He has chronic kidney disease. Most recent labs in September 2025 with creatinine 2.1 with GFR of 28.   He tells me today that he feels well overall. He denies chest pain, dyspnea on exertion, dizziness, near syncope, syncope or LE edema. He lives in [place], [place]. He is a retired psychologist, forensic. He has no active dental issues. He sees a education officer, community regularly.   Primary Care Physician: Toribio Jerel MATSU, MD Primary Cardiologist: JINNY Alvan Referring Cardiologist: JINNY Alvan  Past Medical History:  Diagnosis Date   Aortic stenosis    CKD (chronic kidney disease)    CML (chronic myelocytic leukemia) (HCC)    Hyperlipidemia    Osteoarthritis     Past Surgical History:  Procedure Laterality Date   APPENDECTOMY     CHOLECYSTECTOMY     LAMINECTOMY      Current Outpatient Medications  Medication Sig Dispense Refill   cholecalciferol (VITAMIN D3) 25 MCG (1000 UNIT) tablet Take 1,000 Units by mouth daily.     finasteride (PROSCAR) 5 MG tablet Take 5 mg by mouth daily.     imatinib (GLEEVEC) 400 MG tablet Take 400 mg by mouth daily. Take with meals and large  glass of water.Caution:Chemotherapy.     Magnesium 400 MG CAPS Take by mouth.     Multiple Vitamin (MULTIVITAMIN) capsule Take 1 capsule by mouth daily.     Multiple Vitamins-Minerals (PRESERVISION AREDS PO) Take by mouth.     simvastatin (ZOCOR) 40 MG tablet Take 40 mg by mouth daily.     tamsulosin (FLOMAX) 0.4 MG CAPS capsule Take 0.4 mg by mouth daily.      vitamin B-12 (CYANOCOBALAMIN ) 500 MCG tablet Take 500 mcg by mouth daily.     No current facility-administered medications for this visit.    No Known Allergies  Social History   Socioeconomic History   Marital status: Widowed    Spouse name: Not on file   Number of children: 3   Years of education: Not on file   Highest education level: Not on file  Occupational History   Occupation: Retired psychologist, forensic  Tobacco Use   Smoking status: Never   Smokeless tobacco: Never  Vaping Use   Vaping status: Never Used  Substance and Sexual Activity   Alcohol use: Never   Drug use: Never   Sexual activity: Not on file  Other Topics Concern   Not on file  Social History Narrative   Not on file   Social Drivers of Health   Financial Resource Strain: Not on file  Food Insecurity: Not on file  Transportation Needs: Not on  file  Physical Activity: Not on file  Stress: Not on file  Social Connections: Not on file  Intimate Partner Violence: Not on file    Family History  Problem Relation Age of Onset   Kidney failure Mother    Heart attack Father    Heart attack Sister    Prostate cancer Brother     Review of Systems:  As stated in the HPI and otherwise negative.   BP (!) 128/0   Pulse 67   Ht 5' 7 (1.702 m)   Wt 160 lb 9.6 oz (72.8 kg)   SpO2 98%   BMI 25.15 kg/m   Physical Examination: General: Well developed, well nourished, NAD  HEENT: OP clear, mucus membranes moist  SKIN: warm, dry. No rashes. Neuro: No focal deficits  Musculoskeletal: Muscle strength 5/5 all ext  Psychiatric: Mood and affect  normal  Neck: No JVD, no carotid bruits, no thyromegaly, no lymphadenopathy.  Lungs:Clear bilaterally, no wheezes, rhonci, crackles Cardiovascular: Regular rate and rhythm. Loud, harsh, late peaking systolic murmur.  Abdomen:Soft. Bowel sounds present. Non-tender.  Extremities: No lower extremity edema. Pulses are 2 + in the bilateral DP/PT.  EKG:  EKG is ordered today.- The ekg ordered today demonstrates  EKG Interpretation Date/Time:  Monday March 30 2024 13:34:40 EST Ventricular Rate:  65 PR Interval:  154 QRS Duration:  96 QT Interval:  458 QTC Calculation: 476 R Axis:   39  Text Interpretation: Normal sinus rhythm Normal ECG Confirmed by Verlin Bruckner [phone]) on 03/30/2024 2:20:11 PM   Echo 02/21/24:  1. Left ventricular ejection fraction, by estimation, is 40 to 45%. The  left ventricle has mildly decreased function. The left ventricle  demonstrates global hypokinesis. There is mild left ventricular  hypertrophy. Left ventricular diastolic parameters  are consistent with Grade I diastolic dysfunction (impaired relaxation).  Elevated left atrial pressure. The average left ventricular global  longitudinal strain is -12.7 %. The global longitudinal strain is  abnormal.   2. Right ventricular systolic function is normal. The right ventricular  size is normal. Tricuspid regurgitation signal is inadequate for assessing  PA pressure.   3. A small pericardial effusion is present. The pericardial effusion is  circumferential. There is no evidence of cardiac tamponade.   4. The mitral valve is abnormal. Mild mitral valve regurgitation. No  evidence of mitral stenosis.   5. Severe aortic stenosis. Lower than expected mean gradient may be  secondary to decreased LVEF. 08/12/23 echo mean gradient wasd 51 mmHg in  setting of normal LV function. . The aortic valve has an indeterminant  number of cusps. There is severe  calcifcation of the aortic valve. There is severe  thickening of the aortic  valve. Aortic valve regurgitation is mild to moderate.   6. Aortic dilatation noted. There is mild dilatation of the ascending  aorta, measuring 36 mm.   7. The inferior vena cava is normal in size with greater than 50%  respiratory variability, suggesting right atrial pressure of 3 mmHg.   FINDINGS   Left Ventricle: Left ventricular ejection fraction, by estimation, is 40  to 45%. The left ventricle has mildly decreased function. The left  ventricle demonstrates global hypokinesis. The average left ventricular  global longitudinal strain is -12.7 %.  Strain was performed and the global longitudinal strain is abnormal. The  left ventricular internal cavity size was normal in size. There is mild  left ventricular hypertrophy. Left ventricular diastolic parameters are  consistent with Grade I diastolic  dysfunction (impaired relaxation). Elevated left atrial pressure.   Right Ventricle: The right ventricular size is normal. Right vetricular  wall thickness was not well visualized. Right ventricular systolic  function is normal. Tricuspid regurgitation signal is inadequate for  assessing PA pressure.   Left Atrium: Left atrial size was normal in size.   Right Atrium: Right atrial size was normal in size.   Pericardium: A small pericardial effusion is present. The pericardial  effusion is circumferential. There is no evidence of cardiac tamponade.   Mitral Valve: The mitral valve is abnormal. There is mild thickening of  the mitral valve leaflet(s). There is mild calcification of the mitral  valve leaflet(s). Mild mitral annular calcification. Mild mitral valve  regurgitation. No evidence of mitral  valve stenosis.   Tricuspid Valve: The tricuspid valve is normal in structure. Tricuspid  valve regurgitation is not demonstrated. No evidence of tricuspid  stenosis.   Aortic Valve: Severe aortic stenosis. Lower than expected mean gradient  may be secondary  to decreased LVEF. 08/12/23 echo mean gradient wasd 51  mmHg in setting of normal LV function. The aortic valve has an  indeterminant number of cusps. There is severe  calcifcation of the aortic valve. There is severe thickening of the aortic  valve. There is severe aortic valve annular calcification. Aortic valve  regurgitation is mild to moderate. Aortic regurgitation PHT measures 373  msec. Aortic valve mean gradient  measures 34.0 mmHg. Aortic valve peak gradient measures 55.0 mmHg. Aortic  valve area, by VTI measures 0.86 cm.   Pulmonic Valve: The pulmonic valve was not well visualized. Pulmonic valve  regurgitation is mild. No evidence of pulmonic stenosis.   Aorta: The aortic root is normal in size and structure and aortic  dilatation noted. There is mild dilatation of the ascending aorta,  measuring 36 mm.   Venous: The inferior vena cava is normal in size with greater than 50%  respiratory variability, suggesting right atrial pressure of 3 mmHg.   IAS/Shunts: No atrial level shunt detected by color flow Doppler.     LEFT VENTRICLE  PLAX 2D  LVIDd:         5.20 cm      Diastology  LVIDs:         4.00 cm      LV e' medial:    4.46 cm/s  LV PW:         1.20 cm      LV E/e' medial:  17.4  LV IVS:        1.20 cm      LV e' lateral:   2.94 cm/s  LVOT diam:     1.80 cm      LV E/e' lateral: 26.4  LV SV:         83  LV SV Index:   45           2D Longitudinal Strain  LVOT Area:     2.54 cm     2D Strain GLS Avg:     -12.7 %    LV Volumes (MOD)  LV vol d, MOD A2C: 180.0 ml  LV vol d, MOD A4C: 221.0 ml  LV vol s, MOD A2C: 106.0 ml  LV vol s, MOD A4C: 115.0 ml  LV SV MOD A2C:     74.0 ml  LV SV MOD A4C:     221.0 ml  LV SV MOD BP:      84.3 ml   RIGHT VENTRICLE  IVC  RV Basal diam:  4.50 cm     IVC diam: 1.80 cm  RV Mid diam:    3.30 cm  RV S prime:     11.70 cm/s  TAPSE (M-mode): 1.6 cm   LEFT ATRIUM             Index        RIGHT ATRIUM           Index   LA diam:        3.80 cm 2.06 cm/m   RA Area:     15.80 cm  LA Vol (A2C):   55.1 ml 29.88 ml/m  RA Volume:   38.70 ml  20.99 ml/m  LA Vol (A4C):   41.0 ml 22.23 ml/m  LA Biplane Vol: 47.7 ml 25.87 ml/m   AORTIC VALVE  AV Area (Vmax):    0.84 cm  AV Area (Vmean):   0.80 cm  AV Area (VTI):     0.86 cm  AV Vmax:           370.80 cm/s  AV Vmean:          254.800 cm/s  AV VTI:            0.960 m  AV Peak Grad:      55.0 mmHg  AV Mean Grad:      34.0 mmHg  LVOT Vmax:         122.00 cm/s  LVOT Vmean:        79.900 cm/s  LVOT VTI:          0.325 m  LVOT/AV VTI ratio: 0.34  AI PHT:            373 msec    AORTA  Ao Root diam: 3.40 cm  Ao Asc diam:  3.60 cm   MITRAL VALVE  MV Area (PHT): 2.50 cm     SHUNTS  MV Decel Time: 303 msec     Systemic VTI:  0.32 m  MR Peak grad: 112.8 mmHg    Systemic Diam: 1.80 cm  MR Vmax:      531.00 cm/s  MV E velocity: 77.60 cm/s  MV A velocity: 108.00 cm/s  MV E/A ratio:  0.72   Recent Labs: No results found for requested labs within last 365 days.    Wt Readings from Last 3 Encounters:  03/30/24 160 lb 9.6 oz (72.8 kg)  02/28/24 160 lb 6.4 oz (72.8 kg)  08/02/23 161 lb (73 kg)    Assessment and Plan:   1. Severe Aortic Valve Stenosis: He has severe aortic valve stenosis. NYHA class 1. I have personally reviewed the echo images. The aortic valve is thickened and calcified with limited leaflet mobility. His mean gradient was 51 mm Hg earlier this year when his LV function was normal. Now with LV systolic dysfunction, his mean gradient is lower. I think he would benefit from AVR given the recent fall in his LVEF. Given advanced age, he is not a good candidate for conventional AVR by surgical approach. I think he may be a good candidate for TAVR. We have discussed his advanced kidney failure and discussed the potential for progression of his renal failure after the use of contrast dye for testing pre TAVR and on the day of the TAVR procedure.    I have reviewed the natural history of aortic stenosis with the patient and their family members  who are present today. We have discussed the limitations  of medical therapy and the poor prognosis associated with symptomatic aortic stenosis. We have reviewed potential treatment options, including palliative medical therapy, conventional surgical aortic valve replacement, and transcatheter aortic valve replacement. We discussed treatment options in the context of the patient's specific comorbid medical conditions.   He would like to proceed with planning for TAVR. I will arrange a cardiac catheterization at [facility] 04/17/24 at noon. He will come in for hydration prior to the cath. Risks and benefits of the cath procedure and the valve procedure are reviewed with the patient.  He will need a Nephrology consultation prior to his pre-TAVR CT scans.  After the Nephrology consultation, will arrange a cardiac CT, CTA of the chest/abdomen and pelvis and he will then be referred to see one of the CT surgeons on our TAVR team.     Labs/ tests ordered today include:   Orders Placed This Encounter  Procedures   CBC   Basic Metabolic Panel (BMET)   EKG 12-Lead     Disposition:   F/U will be arranged with the structural team  Signed, [name], MD, [facility] 03/30/2024 2:45 PM    [facility] [address] [place], [place], [place]  [address] Phone: [phone]; Fax: [phone]

## 2024-03-30 NOTE — Progress Notes (Signed)
 Pre Surgical Assessment: 5 M Walk Test  10M=16.81ft  5 Meter Walk Test- trial 1: 4.43 seconds 5 Meter Walk Test- trial 2: 4.29 seconds 5 Meter Walk Test- trial 3: 3.93 seconds 5 Meter Walk Test Average: 4.22 seconds

## 2024-03-30 NOTE — Patient Instructions (Signed)
 Medication Instructions:  Your physician recommends that you continue on your current medications as directed. Please refer to the Current Medication list given to you today.  *If you need a refill on your cardiac medications before your next appointment, please call your pharmacy*  Lab Work: Have lab work done on 11/17--BMP and CBC.  This is not fasting.  Can be done at any [facility] location If you have labs (blood work) drawn today and your tests are completely normal, you will receive your results only by: MyChart Message (if you have MyChart) OR A paper copy in the mail If you have any lab test that is abnormal or we need to change your treatment, we will call you to review the results.  Testing/Procedures: Your physician has requested that you have a cardiac catheterization. Cardiac catheterization is used to diagnose and/or treat various heart conditions. Doctors may recommend this procedure for a number of different reasons. The most common reason is to evaluate chest pain. Chest pain can be a symptom of coronary artery disease (CAD), and cardiac catheterization can show whether plaque is narrowing or blocking your heart's arteries. This procedure is also used to evaluate the valves, as well as measure the blood flow and oxygen levels in different parts of your heart. For further information please visit [email or web address] Please follow instruction sheet, as given. Scheduled for 11/21  Follow-Up: At [facility], you and your health needs are our priority.  As part of our continuing mission to provide you with exceptional heart care, our providers are all part of one team.  This team includes your primary Cardiologist (physician) and Advanced Practice Providers or APPs (Physician Assistants and Nurse Practitioners) who all work together to provide you with the care you need, when you need it.  Your next appointment:   To be determined after procedure  Provider:   Dr Verlin    We recommend signing up for the patient portal called MyChart.  Sign up information is provided on this After Visit Summary.  MyChart is used to connect with patients for Virtual Visits (Telemedicine).  Patients are able to view lab/test results, encounter notes, upcoming appointments, etc.  Non-urgent messages can be sent to your provider as well.   To learn more about what you can do with MyChart, go to [email or web address].   Other Instructions   [facility] A DEPT OF [facility] [address] A DEPT OF THE [facility] [address]  [place] [address] Dept: [phone] Loc: [phone]  Kenneth Busic Sr.  03/30/2024  You are scheduled for a Cardiac Catheterization on Friday, November 21 with Dr. Lonni Verlin.  1. Please arrive at the [facility] (Main Entrance A) at [facility]: [address] [place], [place] [address] at 7:00 AM (This time is 5 hour(s) before your procedure to ensure your preparation).   Free valet parking service is available. You will check in at ADMITTING. The support person will be asked to wait in the waiting room.  It is OK to have someone drop you off and come back when you are ready to be discharged.    Special note: Every effort is made to have your procedure done on time. Please understand that emergencies sometimes delay scheduled procedures.  2. Diet: Light meals may be eaten up to 6 hours before scheduled procedures from 12N and after; please stop eating at 6:00 AM   Light meal consist of plain  toast, fruit, light soups, crackers.   3. Hydration: You need to be well hydrated before your procedure. On November 21, you may drink approved liquids (see below) until 2 hours before the procedure, with 16 oz of water as your last intake.   List of approved liquids water, clear juice, clear tea, black coffee, fruit juices, non-citric and without pulp, carbonated beverages,  Gatorade, Kool -Aid, plain Jello-O and plain ice popsicles.  4. Labs: You will need to have blood drawn on Monday, November 17 at [facility] [address], [place]  Open: 7am - 6pm, Sat 8am - 12 noon   Phone: [phone]. You do not need to be fasting.  5. Medication instructions in preparation for your procedure:   Contrast Allergy: No   On the morning of your procedure, take your Aspirin 81 mg and any morning medicines NOT listed above.  You may use sips of water.  6. Plan to go home the same day, you will only stay overnight if medically necessary. 7. Bring a current list of your medications and current insurance cards. 8. You MUST have a responsible person to drive you home. 9. Someone MUST be with you the first 24 hours after you arrive home or your discharge will be delayed. 10. Please wear clothes that are easy to get on and off and wear slip-on shoes.  Thank you for allowing us  to care for you!   --  Invasive Cardiovascular services

## 2024-03-30 NOTE — Progress Notes (Signed)
 [facility] Consult Note  Chief Complaint  Patient presents with   New Patient (Initial Visit)    Aortic stenosis   History of Present Illness: 89 yo [name] with history of CML in remission, carotid artery disease, CKD stage 3, aortic stenosis and HLD who is here today as a new consult, referred by Dr. Alvan, for further discussion regarding his aortic stenosis and possible TAVR. He has been followed for moderate aortic stenosis. Echo in March 2025 with normal LV systolic function and severe aortic stenosis (mean gradient 51 mmHg) with mild to moderate AI. He was followed at that time as he had no symptoms and his LV function was normal. Most recent echo 02/21/24 with LVEF=40-45% with global hypokinesis. Normal RV function. Mild MR. Severe aortic stenosis with mean gradient 34 mmHg (51 mmHg when LV function was normal), AVA 0.8 cm2. He has chronic kidney disease. Most recent labs in September 2025 with creatinine 2.1 with GFR of 28.   He tells me today that he feels well overall. He denies chest pain, dyspnea on exertion, dizziness, near syncope, syncope or LE edema. He lives in [place], [place]. He is a retired psychologist, forensic. He has no active dental issues. He sees a education officer, community regularly.   Primary Care Physician: Toribio Jerel MATSU, MD Primary Cardiologist: JINNY Alvan Referring Cardiologist: JINNY Alvan  Past Medical History:  Diagnosis Date   Aortic stenosis    CKD (chronic kidney disease)    CML (chronic myelocytic leukemia) (HCC)    Hyperlipidemia    Osteoarthritis     Past Surgical History:  Procedure Laterality Date   APPENDECTOMY     CHOLECYSTECTOMY     LAMINECTOMY      Current Outpatient Medications  Medication Sig Dispense Refill   cholecalciferol (VITAMIN D3) 25 MCG (1000 UNIT) tablet Take 1,000 Units by mouth daily.     finasteride (PROSCAR) 5 MG tablet Take 5 mg by mouth daily.     imatinib (GLEEVEC) 400 MG tablet Take 400 mg by mouth daily. Take with meals and large  glass of water.Caution:Chemotherapy.     Magnesium 400 MG CAPS Take by mouth.     Multiple Vitamin (MULTIVITAMIN) capsule Take 1 capsule by mouth daily.     Multiple Vitamins-Minerals (PRESERVISION AREDS PO) Take by mouth.     simvastatin (ZOCOR) 40 MG tablet Take 40 mg by mouth daily.     tamsulosin (FLOMAX) 0.4 MG CAPS capsule Take 0.4 mg by mouth daily.      vitamin B-12 (CYANOCOBALAMIN ) 500 MCG tablet Take 500 mcg by mouth daily.     No current facility-administered medications for this visit.    No Known Allergies  Social History   Socioeconomic History   Marital status: Widowed    Spouse name: Not on file   Number of children: 3   Years of education: Not on file   Highest education level: Not on file  Occupational History   Occupation: Retired psychologist, forensic  Tobacco Use   Smoking status: Never   Smokeless tobacco: Never  Vaping Use   Vaping status: Never Used  Substance and Sexual Activity   Alcohol use: Never   Drug use: Never   Sexual activity: Not on file  Other Topics Concern   Not on file  Social History Narrative   Not on file   Social Drivers of Health   Financial Resource Strain: Not on file  Food Insecurity: Not on file  Transportation Needs: Not on  file  Physical Activity: Not on file  Stress: Not on file  Social Connections: Not on file  Intimate Partner Violence: Not on file    Family History  Problem Relation Age of Onset   Kidney failure Mother    Heart attack Father    Heart attack Sister    Prostate cancer Brother     Review of Systems:  As stated in the HPI and otherwise negative.   BP (!) 128/0   Pulse 67   Ht 5' 7 (1.702 m)   Wt 160 lb 9.6 oz (72.8 kg)   SpO2 98%   BMI 25.15 kg/m   Physical Examination: General: Well developed, well nourished, NAD  HEENT: OP clear, mucus membranes moist  SKIN: warm, dry. No rashes. Neuro: No focal deficits  Musculoskeletal: Muscle strength 5/5 all ext  Psychiatric: Mood and affect  normal  Neck: No JVD, no carotid bruits, no thyromegaly, no lymphadenopathy.  Lungs:Clear bilaterally, no wheezes, rhonci, crackles Cardiovascular: Regular rate and rhythm. Loud, harsh, late peaking systolic murmur.  Abdomen:Soft. Bowel sounds present. Non-tender.  Extremities: No lower extremity edema. Pulses are 2 + in the bilateral DP/PT.  EKG:  EKG is ordered today.- The ekg ordered today demonstrates  EKG Interpretation Date/Time:  Monday March 30 2024 13:34:40 EST Ventricular Rate:  65 PR Interval:  154 QRS Duration:  96 QT Interval:  458 QTC Calculation: 476 R Axis:   39  Text Interpretation: Normal sinus rhythm Normal ECG Confirmed by Verlin Bruckner [phone]) on 03/30/2024 2:20:11 PM   Echo 02/21/24:  1. Left ventricular ejection fraction, by estimation, is 40 to 45%. The  left ventricle has mildly decreased function. The left ventricle  demonstrates global hypokinesis. There is mild left ventricular  hypertrophy. Left ventricular diastolic parameters  are consistent with Grade I diastolic dysfunction (impaired relaxation).  Elevated left atrial pressure. The average left ventricular global  longitudinal strain is -12.7 %. The global longitudinal strain is  abnormal.   2. Right ventricular systolic function is normal. The right ventricular  size is normal. Tricuspid regurgitation signal is inadequate for assessing  PA pressure.   3. A small pericardial effusion is present. The pericardial effusion is  circumferential. There is no evidence of cardiac tamponade.   4. The mitral valve is abnormal. Mild mitral valve regurgitation. No  evidence of mitral stenosis.   5. Severe aortic stenosis. Lower than expected mean gradient may be  secondary to decreased LVEF. 08/12/23 echo mean gradient wasd 51 mmHg in  setting of normal LV function. . The aortic valve has an indeterminant  number of cusps. There is severe  calcifcation of the aortic valve. There is severe  thickening of the aortic  valve. Aortic valve regurgitation is mild to moderate.   6. Aortic dilatation noted. There is mild dilatation of the ascending  aorta, measuring 36 mm.   7. The inferior vena cava is normal in size with greater than 50%  respiratory variability, suggesting right atrial pressure of 3 mmHg.   FINDINGS   Left Ventricle: Left ventricular ejection fraction, by estimation, is 40  to 45%. The left ventricle has mildly decreased function. The left  ventricle demonstrates global hypokinesis. The average left ventricular  global longitudinal strain is -12.7 %.  Strain was performed and the global longitudinal strain is abnormal. The  left ventricular internal cavity size was normal in size. There is mild  left ventricular hypertrophy. Left ventricular diastolic parameters are  consistent with Grade I diastolic  dysfunction (impaired relaxation). Elevated left atrial pressure.   Right Ventricle: The right ventricular size is normal. Right vetricular  wall thickness was not well visualized. Right ventricular systolic  function is normal. Tricuspid regurgitation signal is inadequate for  assessing PA pressure.   Left Atrium: Left atrial size was normal in size.   Right Atrium: Right atrial size was normal in size.   Pericardium: A small pericardial effusion is present. The pericardial  effusion is circumferential. There is no evidence of cardiac tamponade.   Mitral Valve: The mitral valve is abnormal. There is mild thickening of  the mitral valve leaflet(s). There is mild calcification of the mitral  valve leaflet(s). Mild mitral annular calcification. Mild mitral valve  regurgitation. No evidence of mitral  valve stenosis.   Tricuspid Valve: The tricuspid valve is normal in structure. Tricuspid  valve regurgitation is not demonstrated. No evidence of tricuspid  stenosis.   Aortic Valve: Severe aortic stenosis. Lower than expected mean gradient  may be secondary  to decreased LVEF. 08/12/23 echo mean gradient wasd 51  mmHg in setting of normal LV function. The aortic valve has an  indeterminant number of cusps. There is severe  calcifcation of the aortic valve. There is severe thickening of the aortic  valve. There is severe aortic valve annular calcification. Aortic valve  regurgitation is mild to moderate. Aortic regurgitation PHT measures 373  msec. Aortic valve mean gradient  measures 34.0 mmHg. Aortic valve peak gradient measures 55.0 mmHg. Aortic  valve area, by VTI measures 0.86 cm.   Pulmonic Valve: The pulmonic valve was not well visualized. Pulmonic valve  regurgitation is mild. No evidence of pulmonic stenosis.   Aorta: The aortic root is normal in size and structure and aortic  dilatation noted. There is mild dilatation of the ascending aorta,  measuring 36 mm.   Venous: The inferior vena cava is normal in size with greater than 50%  respiratory variability, suggesting right atrial pressure of 3 mmHg.   IAS/Shunts: No atrial level shunt detected by color flow Doppler.     LEFT VENTRICLE  PLAX 2D  LVIDd:         5.20 cm      Diastology  LVIDs:         4.00 cm      LV e' medial:    4.46 cm/s  LV PW:         1.20 cm      LV E/e' medial:  17.4  LV IVS:        1.20 cm      LV e' lateral:   2.94 cm/s  LVOT diam:     1.80 cm      LV E/e' lateral: 26.4  LV SV:         83  LV SV Index:   45           2D Longitudinal Strain  LVOT Area:     2.54 cm     2D Strain GLS Avg:     -12.7 %    LV Volumes (MOD)  LV vol d, MOD A2C: 180.0 ml  LV vol d, MOD A4C: 221.0 ml  LV vol s, MOD A2C: 106.0 ml  LV vol s, MOD A4C: 115.0 ml  LV SV MOD A2C:     74.0 ml  LV SV MOD A4C:     221.0 ml  LV SV MOD BP:      84.3 ml   RIGHT VENTRICLE  IVC  RV Basal diam:  4.50 cm     IVC diam: 1.80 cm  RV Mid diam:    3.30 cm  RV S prime:     11.70 cm/s  TAPSE (M-mode): 1.6 cm   LEFT ATRIUM             Index        RIGHT ATRIUM           Index   LA diam:        3.80 cm 2.06 cm/m   RA Area:     15.80 cm  LA Vol (A2C):   55.1 ml 29.88 ml/m  RA Volume:   38.70 ml  20.99 ml/m  LA Vol (A4C):   41.0 ml 22.23 ml/m  LA Biplane Vol: 47.7 ml 25.87 ml/m   AORTIC VALVE  AV Area (Vmax):    0.84 cm  AV Area (Vmean):   0.80 cm  AV Area (VTI):     0.86 cm  AV Vmax:           370.80 cm/s  AV Vmean:          254.800 cm/s  AV VTI:            0.960 m  AV Peak Grad:      55.0 mmHg  AV Mean Grad:      34.0 mmHg  LVOT Vmax:         122.00 cm/s  LVOT Vmean:        79.900 cm/s  LVOT VTI:          0.325 m  LVOT/AV VTI ratio: 0.34  AI PHT:            373 msec    AORTA  Ao Root diam: 3.40 cm  Ao Asc diam:  3.60 cm   MITRAL VALVE  MV Area (PHT): 2.50 cm     SHUNTS  MV Decel Time: 303 msec     Systemic VTI:  0.32 m  MR Peak grad: 112.8 mmHg    Systemic Diam: 1.80 cm  MR Vmax:      531.00 cm/s  MV E velocity: 77.60 cm/s  MV A velocity: 108.00 cm/s  MV E/A ratio:  0.72   Recent Labs: No results found for requested labs within last 365 days.    Wt Readings from Last 3 Encounters:  03/30/24 160 lb 9.6 oz (72.8 kg)  02/28/24 160 lb 6.4 oz (72.8 kg)  08/02/23 161 lb (73 kg)    Assessment and Plan:   1. Severe Aortic Valve Stenosis: He has severe aortic valve stenosis. NYHA class 1. I have personally reviewed the echo images. The aortic valve is thickened and calcified with limited leaflet mobility. His mean gradient was 51 mm Hg earlier this year when his LV function was normal. Now with LV systolic dysfunction, his mean gradient is lower. I think he would benefit from AVR given the recent fall in his LVEF. Given advanced age, he is not a good candidate for conventional AVR by surgical approach. I think he may be a good candidate for TAVR. We have discussed his advanced kidney failure and discussed the potential for progression of his renal failure after the use of contrast dye for testing pre TAVR and on the day of the TAVR procedure.    I have reviewed the natural history of aortic stenosis with the patient and their family members  who are present today. We have discussed the limitations  of medical therapy and the poor prognosis associated with symptomatic aortic stenosis. We have reviewed potential treatment options, including palliative medical therapy, conventional surgical aortic valve replacement, and transcatheter aortic valve replacement. We discussed treatment options in the context of the patient's specific comorbid medical conditions.   He would like to proceed with planning for TAVR. I will arrange a cardiac catheterization at [facility] 04/17/24 at noon. He will come in for hydration prior to the cath. Risks and benefits of the cath procedure and the valve procedure are reviewed with the patient.  He will need a Nephrology consultation prior to his pre-TAVR CT scans.  After the Nephrology consultation, will arrange a cardiac CT, CTA of the chest/abdomen and pelvis and he will then be referred to see one of the CT surgeons on our TAVR team.     Labs/ tests ordered today include:   Orders Placed This Encounter  Procedures   CBC   Basic Metabolic Panel (BMET)   EKG 12-Lead     Disposition:   F/U will be arranged with the structural team  Signed, [name], MD, [facility] 03/30/2024 2:45 PM    [facility] [address] [place], [place], [place]  [address] Phone: [phone]; Fax: [phone]

## 2024-04-13 DIAGNOSIS — I350 Nonrheumatic aortic (valve) stenosis: Secondary | ICD-10-CM | POA: Diagnosis not present

## 2024-04-14 ENCOUNTER — Ambulatory Visit: Payer: Self-pay | Admitting: Cardiovascular Disease

## 2024-04-14 ENCOUNTER — Other Ambulatory Visit: Payer: Self-pay

## 2024-04-14 ENCOUNTER — Telehealth: Payer: Self-pay | Admitting: Cardiovascular Disease

## 2024-04-14 DIAGNOSIS — I350 Nonrheumatic aortic (valve) stenosis: Secondary | ICD-10-CM

## 2024-04-14 DIAGNOSIS — E785 Hyperlipidemia, unspecified: Secondary | ICD-10-CM

## 2024-04-14 DIAGNOSIS — E782 Mixed hyperlipidemia: Secondary | ICD-10-CM

## 2024-04-14 NOTE — Telephone Encounter (Signed)
 [facility] calling with critical lab results for pt. Call transferred to triage.

## 2024-04-14 NOTE — Telephone Encounter (Signed)
 I returned [facility]'s call for a critical lab result for the patient. They stated that the patient's RBC is 2.59 and potassium is 5.9. I will let the patient's provider know of this immediately.

## 2024-04-14 NOTE — Telephone Encounter (Signed)
 I spoke with the DOD about this critical lab result and he reccommended that we call the patient and do repeat blood work. The repeat blood work is because his RBC and Potassium are abnormal and this patient has a procedure scheduled with Dr. Verlin on 04/17/24. I called the patient and he states he is doing well and has no symptoms. I let him know of the results and the recommendations. He was appreciative of the call and asked if I could call his daughter to let her know because she will be the one to take him for the repeat blood work. I called his daughter after I got off of the phone with him and let her know of the lab results and the DOD recommendations. She said they can get that taken care of today. I put the repeat CBC and BMP orders in and will let his providers know as well.

## 2024-04-14 NOTE — Telephone Encounter (Signed)
 Corean taking another STAT call. [phone] option 1

## 2024-04-15 ENCOUNTER — Ambulatory Visit: Payer: Self-pay | Admitting: Cardiovascular Disease

## 2024-04-15 ENCOUNTER — Telehealth: Payer: Self-pay | Admitting: *Deleted

## 2024-04-15 LAB — BASIC METABOLIC PANEL WITH GFR
BUN/Creatinine Ratio: 13 (ref 10–24)
BUN/Creatinine Ratio: 14 (ref 10–24)
BUN: 31 mg/dL — AB (ref 8–27)
BUN: 33 mg/dL — ABNORMAL HIGH (ref 8–27)
CO2: 18 mmol/L — AB (ref 20–29)
CO2: 20 mmol/L (ref 20–29)
Calcium: 9.1 mg/dL (ref 8.6–10.2)
Calcium: 8.6 mg/dL (ref 8.6–10.2)
Chloride: 109 mmol/L — AB (ref 96–106)
Chloride: 107 mmol/L — ABNORMAL HIGH (ref 96–106)
Creatinine, Ser: 2.43 mg/dL — AB (ref 0.76–1.27)
Creatinine, Ser: 2.31 mg/dL — ABNORMAL HIGH (ref 0.76–1.27)
Glucose: 101 mg/dL — AB (ref 70–99)
Glucose: 99 mg/dL (ref 70–99)
Potassium: 5.9 mmol/L — AB (ref 3.5–5.2)
Potassium: 5.6 mmol/L — ABNORMAL HIGH (ref 3.5–5.2)
Sodium: 142 mmol/L (ref 134–144)
Sodium: 140 mmol/L (ref 134–144)
eGFR: 25 mL/min/1.73 — AB (ref >59)
eGFR: 27 mL/min/1.73 — ABNORMAL LOW (ref >59)

## 2024-04-15 LAB — CBC
Hematocrit: 27.9 % — ABNORMAL LOW (ref 37.5–51.0)
Hemoglobin: 8.9 g/dL — ABNORMAL LOW (ref 13.0–17.7)
MCH: 34.4 pg — ABNORMAL HIGH (ref 26.6–33.0)
MCHC: 31.9 g/dL (ref 31.5–35.7)
MCV: 108 fL — ABNORMAL HIGH (ref 79–97)
Platelets: 170 x10E3/uL (ref 150–450)
RBC: 2.59 x10E6/uL — CL (ref 4.14–5.80)
RDW: 11.8 % (ref 11.6–15.4)
WBC: 7 x10E3/uL (ref 3.4–10.8)

## 2024-04-15 NOTE — Telephone Encounter (Addendum)
 Cardiac Catheterization scheduled at [facility] for: Friday April 17, 2024 12 Noon Arrival time [facility] Main Entrance A at: 7 AM-pre-procedure hydration  Diet:  -Can have light meal until 6 AM (6 hours before procedure time)  Plain toast/crackers/simple soups/fruit.  Hydration: -May drink clear liquids until 2 hours before the procedure.  Approved liquids: Water, clear tea, black coffee, fruit juices-non-citric and without pulp,Gatorade, plain Jello/popsicles.  No PO hydration (16 oz water)-going in early for IV hydration.  Medication instructions: -Usual morning medications can be taken including aspirin 81 mg.  Plan to go home the same day, you will only stay overnight if medically necessary.  You must have responsible adult to drive you home.  Someone must be with you the first 24 hours after you arrive home.  Reviewed procedure instruction with patient's daughter (DPR, [place] will repeat BMP on arrival to Short Stay AM of procedure.

## 2024-04-17 ENCOUNTER — Other Ambulatory Visit: Payer: Self-pay

## 2024-04-17 ENCOUNTER — Ambulatory Visit (HOSPITAL_COMMUNITY)
Admission: RE | Admit: 2024-04-17 | Discharge: 2024-04-17 | Disposition: A | Discharge status: Home / Self Care | Attending: Cardiovascular Disease | Admitting: Cardiovascular Disease

## 2024-04-17 ENCOUNTER — Encounter (HOSPITAL_COMMUNITY): Admission: RE | Disposition: A | Payer: Self-pay | Source: Home / Self Care | Attending: Cardiovascular Disease

## 2024-04-17 DIAGNOSIS — C9211 Chronic myeloid leukemia, BCR/ABL-positive, in remission: Secondary | ICD-10-CM | POA: Insufficient documentation

## 2024-04-17 DIAGNOSIS — I2510 Atherosclerotic heart disease of native coronary artery without angina pectoris: Secondary | ICD-10-CM | POA: Diagnosis not present

## 2024-04-17 DIAGNOSIS — I350 Nonrheumatic aortic (valve) stenosis: Secondary | ICD-10-CM | POA: Insufficient documentation

## 2024-04-17 DIAGNOSIS — Z79899 Other long term (current) drug therapy: Secondary | ICD-10-CM | POA: Diagnosis not present

## 2024-04-17 DIAGNOSIS — I2584 Coronary atherosclerosis due to calcified coronary lesion: Secondary | ICD-10-CM | POA: Diagnosis not present

## 2024-04-17 DIAGNOSIS — E785 Hyperlipidemia, unspecified: Secondary | ICD-10-CM | POA: Diagnosis not present

## 2024-04-17 DIAGNOSIS — N183 Chronic kidney disease, stage 3 unspecified: Secondary | ICD-10-CM | POA: Diagnosis not present

## 2024-04-17 DIAGNOSIS — E875 Hyperkalemia: Secondary | ICD-10-CM

## 2024-04-17 HISTORY — PX: CORONARY ANGIOGRAPHY: CATH118303

## 2024-04-17 LAB — BASIC METABOLIC PANEL WITH GFR
Anion gap: 10 (ref 5–15)
BUN: 30 mg/dL — ABNORMAL HIGH (ref 8–23)
CO2: 20 mmol/L — ABNORMAL LOW (ref 22–32)
Calcium: 8.6 mg/dL — ABNORMAL LOW (ref 8.9–10.3)
Chloride: 108 mmol/L (ref 98–111)
Creatinine, Ser: 2.19 mg/dL — ABNORMAL HIGH (ref 0.61–1.24)
GFR, Estimated: 28 mL/min — ABNORMAL LOW (ref >60)
Glucose, Bld: 103 mg/dL — ABNORMAL HIGH (ref 70–99)
Potassium: 4.5 mmol/L (ref 3.5–5.1)
Sodium: 138 mmol/L (ref 135–145)

## 2024-04-17 SURGERY — CORONARY ANGIOGRAPHY (CATH LAB)
Anesthesia: LOCAL

## 2024-04-17 MED ORDER — HEPARIN (PORCINE) IN NACL 2-0.9 UNITS/ML
INTRAMUSCULAR | Status: DC | PRN
  Administered 2024-04-17: 10 mL via INTRA_ARTERIAL

## 2024-04-17 MED ORDER — SODIUM CHLORIDE 0.9 % IV SOLN: INTRAVENOUS | Status: AC

## 2024-04-17 MED ORDER — LIDOCAINE HCL (PF) 1 % IJ SOLN
INTRAMUSCULAR | Status: AC
  Filled 2024-04-17: qty 30

## 2024-04-17 MED ORDER — ASPIRIN 81 MG PO CHEW: 81.0000 mg | CHEWABLE_TABLET | ORAL | Status: DC

## 2024-04-17 MED ORDER — VERAPAMIL HCL 2.5 MG/ML IV SOLN
INTRAVENOUS | Status: AC
Start: 2024-04-17 — End: 2024-04-17
  Filled 2024-04-17: qty 2

## 2024-04-17 MED ORDER — ONDANSETRON HCL 4 MG/2ML IJ SOLN: 4.0000 mg | Freq: Four times a day (QID) | INTRAMUSCULAR | Status: DC | PRN

## 2024-04-17 MED ORDER — SODIUM CHLORIDE 0.9% FLUSH: 3.0000 mL | Freq: Two times a day (BID) | INTRAVENOUS | Status: DC

## 2024-04-17 MED ORDER — MIDAZOLAM HCL (PF) 2 MG/2ML IJ SOLN
INTRAMUSCULAR | Status: DC | PRN
Start: 2024-04-17 — End: 2024-04-17
  Administered 2024-04-17: 1 mg via INTRAVENOUS

## 2024-04-17 MED ORDER — FENTANYL CITRATE (PF) 100 MCG/2ML IJ SOLN
INTRAMUSCULAR | Status: AC
  Filled 2024-04-17: qty 2

## 2024-04-17 MED ORDER — ACETAMINOPHEN 325 MG PO TABS: 650.0000 mg | ORAL_TABLET | ORAL | Status: DC | PRN

## 2024-04-17 MED ORDER — SODIUM CHLORIDE 0.9 % WEIGHT BASED INFUSION
3.0000 mL/kg/h | INTRAVENOUS | Status: AC
  Administered 2024-04-17: 3 mL/kg/h via INTRAVENOUS

## 2024-04-17 MED ORDER — SODIUM CHLORIDE 0.9% FLUSH: 3.0000 mL | INTRAVENOUS | Status: DC | PRN

## 2024-04-17 MED ORDER — HEPARIN (PORCINE) IN NACL 1000-0.9 UT/500ML-% IV SOLN
INTRAVENOUS | Status: DC | PRN
Start: 2024-04-17 — End: 2024-04-17
  Administered 2024-04-17 (×2): 500 mL

## 2024-04-17 MED ORDER — MIDAZOLAM HCL 2 MG/2ML IJ SOLN
INTRAMUSCULAR | Status: AC
  Filled 2024-04-17: qty 2

## 2024-04-17 MED ORDER — LIDOCAINE HCL (PF) 1 % IJ SOLN
INTRAMUSCULAR | Status: DC | PRN
Start: 2024-04-17 — End: 2024-04-17
  Administered 2024-04-17: 5 mL via INTRADERMAL

## 2024-04-17 MED ORDER — HEPARIN SODIUM (PORCINE) 1000 UNIT/ML IJ SOLN
INTRAMUSCULAR | Status: DC | PRN
  Administered 2024-04-17: 3500 [IU] via INTRAVENOUS

## 2024-04-17 MED ORDER — SODIUM CHLORIDE 0.9 % WEIGHT BASED INFUSION: 1.0000 mL/kg/h | INTRAVENOUS | Status: DC

## 2024-04-17 MED ORDER — IOHEXOL 350 MG/ML SOLN
INTRAVENOUS | Status: DC | PRN
  Administered 2024-04-17: 40 mL

## 2024-04-17 MED ORDER — LABETALOL HCL 5 MG/ML IV SOLN: 10.0000 mg | INTRAVENOUS | Status: DC | PRN

## 2024-04-17 MED ORDER — HYDRALAZINE HCL 20 MG/ML IJ SOLN: 10.0000 mg | INTRAMUSCULAR | Status: DC | PRN

## 2024-04-17 MED ORDER — FENTANYL CITRATE (PF) 100 MCG/2ML IJ SOLN
INTRAMUSCULAR | Status: DC | PRN
  Administered 2024-04-17: 25 ug via INTRAVENOUS

## 2024-04-17 MED ORDER — SODIUM CHLORIDE 0.9 % IV SOLN: 250.0000 mL | INTRAVENOUS | Status: DC | PRN

## 2024-04-17 MED ORDER — HEPARIN SODIUM (PORCINE) 1000 UNIT/ML IJ SOLN
INTRAMUSCULAR | Status: AC
  Filled 2024-04-17: qty 10

## 2024-04-17 SURGICAL SUPPLY — 7 items
CATH INFINITI 5 FR JL3.5 (CATHETERS) IMPLANT
CATH INFINITI JR4 5F (CATHETERS) IMPLANT
DEVICE RAD COMP TR BAND LRG (VASCULAR PRODUCTS) IMPLANT
GLIDESHEATH SLEND SS 6F .021 (SHEATH) IMPLANT
GUIDEWIRE INQWIRE 1.5J.035X260 (WIRE) IMPLANT
PACK CARDIAC CATHETERIZATION (CUSTOM PROCEDURE TRAY) ×1 IMPLANT
SET ATX-X65L (MISCELLANEOUS) IMPLANT

## 2024-04-17 NOTE — Progress Notes (Signed)
 Discharge instructions reviewed with patient and son at bedside. Denies questions concerns. PT tolerated PO intake. Was able to void without difficulty. Incision site remains clean dry and intact. No s/s of complications. PT escorted from the unit via wheel chair to personal vehicle.

## 2024-04-17 NOTE — Interval H&P Note (Signed)
 History and Physical Interval Note:  04/17/2024 1:04 PM  Kenneth Zaida Roys Sr.  has presented today for surgery, with the diagnosis of aortic stenosis.  The various methods of treatment have been discussed with the patient and family. After consideration of risks, benefits and other options for treatment, the patient has consented to  Procedure(s): LEFT HEART CATH AND CORONARY ANGIOGRAPHY (N/A) as a surgical intervention.  The patient's history has been reviewed, patient examined, no change in status, stable for surgery.  I have reviewed the patient's chart and labs.  Questions were answered to the patient's satisfaction.    Cath Lab Visit (complete for each Cath Lab visit)  Clinical Evaluation Leading to the Procedure:   ACS: No.  Non-ACS:    Anginal Classification: CCS I  Anti-ischemic medical therapy: No Therapy  Non-Invasive Test Results: No non-invasive testing performed  Prior CABG: No previous CABG        [name]

## 2024-04-18 ENCOUNTER — Encounter (HOSPITAL_COMMUNITY): Payer: Self-pay | Admitting: Cardiovascular Disease

## 2024-04-30 ENCOUNTER — Telehealth: Payer: Self-pay | Admitting: Cardiovascular Disease

## 2024-04-30 DIAGNOSIS — I350 Nonrheumatic aortic (valve) stenosis: Secondary | ICD-10-CM

## 2024-04-30 NOTE — Telephone Encounter (Signed)
  Patient daughter Katheryn called to follow up the referral the to a kidney doctor. She said during the patients's last visit with Dr. Verlin he told him he will refer him to a kidney doctor and they haven't heard anything about it since then

## 2024-04-30 NOTE — Telephone Encounter (Signed)
 Nephrology consult put in, per Dr. Verlin. Daughter updated. Spoke with [employer] daughter request- and requested they call her with appointment, information etc.

## 2024-05-12 NOTE — Telephone Encounter (Signed)
 I followed up with [facility] today and they said that the patient's referral has gone through and they have attempted to call the patient to schedule but were unable to reach him. I called the patient's daughter Katheryn to let her know that they can call the office to schedule the patient's appt at [phone]. The number that had been given to Katheryn previously was for [employer]. which is not the the office the patient was referred to. The patient has been referred to [facility]

## 2024-05-12 NOTE — Telephone Encounter (Signed)
 Daughter Jeryl) stated [employer] has not received patient's referral as yet and wants the referral re-sent.  Daughter noted their fax number is [phone], Attention:  Lonell.

## 2024-05-18 NOTE — Telephone Encounter (Signed)
 I spoke with [facility] this morning and they said that the referral coordinator was not in the office yet but it did not appear that the Kenneth [name] had an appointment scheduled. However, when I spoke to his daughter Kenneth [name] this morning she said that the patient does have an nephrology appointment scheduled for 05/20/24 at 8:30AM.

## 2024-05-22 ENCOUNTER — Encounter: Payer: Self-pay | Admitting: Nephrology

## 2024-05-25 ENCOUNTER — Other Ambulatory Visit (HOSPITAL_COMMUNITY): Payer: Self-pay | Admitting: Nephrology

## 2024-05-25 DIAGNOSIS — N184 Chronic kidney disease, stage 4 (severe): Secondary | ICD-10-CM

## 2024-06-02 ENCOUNTER — Ambulatory Visit (HOSPITAL_COMMUNITY)
Admission: RE | Admit: 2024-06-02 | Discharge: 2024-06-02 | Disposition: A | Discharge status: Home / Self Care | Source: Ambulatory Visit | Attending: Nephrology | Admitting: Nephrology

## 2024-06-02 DIAGNOSIS — N3289 Other specified disorders of bladder: Secondary | ICD-10-CM | POA: Insufficient documentation

## 2024-06-02 DIAGNOSIS — N184 Chronic kidney disease, stage 4 (severe): Secondary | ICD-10-CM | POA: Diagnosis present

## 2024-06-02 DIAGNOSIS — N210 Calculus in bladder: Secondary | ICD-10-CM | POA: Insufficient documentation

## 2024-06-02 DIAGNOSIS — N400 Enlarged prostate without lower urinary tract symptoms: Secondary | ICD-10-CM | POA: Insufficient documentation

## 2024-06-07 NOTE — Progress Notes (Unsigned)
 "  Cardiology Office Note    Date:  06/09/2024  ID:  Kenneth Zaida Roys Sr., DOB Aug 17, 1933, MRN [id number] Cardiologist: Alvan Carrier, MD Cardiology APP:  Johnson Kenneth HERO, PA-C  Structural Heart:  [name], MD{ :  History of Present Illness:    Kenneth Longmore Sr. is a 89 y.o. 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  He was last examined by myself in 02/2024 and remained active at baseline and denied any recent chest pain or dyspnea on exertion. Did report worsening fatigue over the past few years which he felt was secondary to age. It had previously been recommended to refer to the Structural Heart Team for possible workup of TAVR but was unclear if he would be a candidate given his Stage III-IV CKD. He did meet with Dr. Cash in 03/2024 and was felt that he would overall be a good candidate for TAVR but there was concern given the contrast dye used for pre-TAVR CT scans. He was referred to Nephrology and then based off of their recommendations, would be referred to one of the CT surgeons.  He underwent cardiac catheterization on 04/17/2024 which showed mild to moderate nonobstructive CAD and was recommended to continue with workup for TAVR. By review of notes in the interim, he was scheduled to establish with [facility] in 04/2024.  In talking with the patient, his daughter and his son today, he reports overall feeling well since his last visit.  He denies any recent chest pain or palpitations. Does report fatigue but feels like this is due to his age.  Respiratory status has been stable and no recent orthopnea, PND or pitting edema. He questions if Gleevac has led to worsening kidney function over the past several years and is awaiting an appointment with  Oncology/Hematology as he was referred to them by Nephrology. In discussion with the Structural Heart team, they are planning for direct admission for hydration next Thursday and plans for CT imaging on Friday. Discussed possibly having him admitted and remaining inpatient for TAVR on 06/24/2024 but he is not keen on this idea given the prolonged hospitalization.  Studies Reviewed:   EKG: EKG is not ordered today.  Echocardiogram: 01/2024 IMPRESSIONS     1. Left ventricular ejection fraction, by estimation, is 40 to 45%. The  left ventricle has mildly decreased function. The left ventricle  demonstrates global hypokinesis. There is mild left ventricular  hypertrophy. Left ventricular diastolic parameters  are consistent with Grade I diastolic dysfunction (impaired relaxation).  Elevated left atrial pressure. The average left ventricular global  longitudinal strain is -12.7 %. The global longitudinal strain is  abnormal.   2. Right ventricular systolic function is normal. The right ventricular  size is normal. Tricuspid regurgitation signal is inadequate for assessing  PA pressure.   3. A small pericardial effusion is present. The pericardial effusion is  circumferential. There is no evidence of cardiac tamponade.   4. The mitral valve is abnormal. Mild mitral valve regurgitation. No  evidence of mitral stenosis.   5. Severe aortic stenosis. Lower than expected mean gradient may be  secondary to decreased LVEF. 08/12/23 echo mean gradient wasd 51 mmHg in  setting of normal LV function. . The aortic valve has an indeterminant  number of cusps. There is severe  calcifcation of the aortic valve. There is severe thickening of the aortic  valve. Aortic valve regurgitation is mild to moderate.   6. Aortic dilatation noted. There is mild dilatation of the ascending  aorta, measuring 36 mm.   7. The inferior vena cava is normal in size with greater than 50%  respiratory variability, suggesting  right atrial pressure of 3 mmHg.   Cardiac Catheterization: 03/2024   Mid RCA to Dist RCA lesion is 30% stenosed.   1st Mrg lesion is 50% stenosed.   Prox Cx lesion is 30% stenosed.   Ost LAD to Mid LAD lesion is 40% stenosed.   Mild to moderate non-obstructive CAD There is mild calcified plaque throughout the proximal LAD The Circumflex has a mild proximal stenosis. The moderate caliber obtuse marginal branch has a moderate stenosis.  The large dominant RCA has mild diffuse non-obstructive disease   Recommendations: Medical management of non-obstructive CAD. Continue workup for TAVR.    Physical Exam:   VS:  BP 120/68 (BP Location: Left Arm, Cuff Size: Normal)   Pulse 73   Ht 5' 7 (1.702 m)   Wt 159 lb 6.4 oz (72.3 kg)   SpO2 97%   BMI 24.97 kg/m    Wt Readings from Last 3 Encounters:  06/09/24 159 lb 6.4 oz (72.3 kg)  04/17/24 160 lb 9.6 oz (72.8 kg)  03/30/24 160 lb 9.6 oz (72.8 kg)     GEN: Pleasant, elderly male appearing in no acute distress NECK: No JVD; No carotid bruits CARDIAC: RRR, 3/6 SEM along RUSB.  RESPIRATORY:  Clear to auscultation without rales, wheezing or rhonchi  ABDOMEN: Appears non-distended. No obvious abdominal masses. EXTREMITIES: No clubbing or cyanosis. No pitting edema.  Distal pedal pulses are 2+ bilaterally.   Assessment and Plan:   1. Severe aortic stenosis/HFmrEF - Echocardiogram in 01/2024 showed severe AS with mean gradient at 51 mmHg and EF was reduced at 40 to 45%. He is being followed by the Structural Heart Team but additional workup for TAVR was on hold until Nephrology consultation was obtained. - In talking with the Structural Heart Team, they are planning for direct admission for IV fluids next Thursday and then obtaining his TAVR scans on 06/19/2024. Based off his renal function, will need to remain admitted for 24 to 48 hours for follow-up of this. I did not add additional GDMT for his cardiomyopathy today as would be concerned  about adding an SGLT2i, ACE-I, ARB or ARNI given his renal function and would not add a beta-blocker at this time given his severe AS. BP was previously soft and did not allow for the addition of Hydralazine  or Nitrates. Will follow EF after TAVR to assess for improvement and to see if changes need to be made in medical therapy at that time.  2. Coronary artery disease involving native coronary artery of native heart without angina pectoris - Cardiac catheterization in 03/2024 showed mild to moderate nonobstructive disease as outlined above.  He remains active at baseline and denies any recent anginal symptoms.   - Would anticipate starting ASA following TAVR. Continue Simvastatin 40 mg daily.  3. Hyperlipidemia LDL goal <70 - His LDL was at 41 when checked in 01/2024. Continue current medical therapy with Simvastatin 40 mg daily.  4. Bilateral carotid artery stenosis - Carotid Dopplers in 07/2021 showed 1 to 39% stenosis bilaterally. Remains on statin therapy.  5. Stage 3-4 CKD - His creatinine was at 2.33 when checked in 04/2024.  Followed by  [facility] (previously Dr. Rayburn).   Signed, Kenneth CHRISTELLA Qua, PA-C   "

## 2024-06-09 ENCOUNTER — Encounter: Payer: Self-pay | Admitting: Student

## 2024-06-09 ENCOUNTER — Ambulatory Visit: Attending: Student | Admitting: Student

## 2024-06-09 VITALS — BP 120/68 | HR 73 | Ht 67.0 in | Wt 159.4 lb

## 2024-06-09 DIAGNOSIS — N184 Chronic kidney disease, stage 4 (severe): Secondary | ICD-10-CM | POA: Diagnosis not present

## 2024-06-09 DIAGNOSIS — I350 Nonrheumatic aortic (valve) stenosis: Secondary | ICD-10-CM

## 2024-06-09 DIAGNOSIS — I6523 Occlusion and stenosis of bilateral carotid arteries: Secondary | ICD-10-CM

## 2024-06-09 DIAGNOSIS — E785 Hyperlipidemia, unspecified: Secondary | ICD-10-CM | POA: Diagnosis not present

## 2024-06-09 DIAGNOSIS — I2510 Atherosclerotic heart disease of native coronary artery without angina pectoris: Secondary | ICD-10-CM

## 2024-06-09 DIAGNOSIS — I5020 Unspecified systolic (congestive) heart failure: Secondary | ICD-10-CM

## 2024-06-09 NOTE — Patient Instructions (Signed)
 Medication Instructions:  Your physician recommends that you continue on your current medications as directed. Please refer to the Current Medication list given to you today.  *If you need a refill on your cardiac medications before your next appointment, please call your pharmacy*  Lab Work: NONE   If you have labs (blood work) drawn today and your tests are completely normal, you will receive your results only by: MyChart Message (if you have MyChart) OR A paper copy in the mail If you have any lab test that is abnormal or we need to change your treatment, we will call you to review the results.  Testing/Procedures: NONE   Follow-Up: At [facility], you and your health needs are our priority.  As part of our continuing mission to provide you with exceptional heart care, our providers are all part of one team.  This team includes your primary Cardiologist (physician) and Advanced Practice Providers or APPs (Physician Assistants and Nurse Practitioners) who all work together to provide you with the care you need, when you need it.  Your next appointment:   3 -4 month(s)  Provider:   Dorn Ross, MD or Laymon Qua, PA-C    We recommend signing up for the patient portal called MyChart.  Sign up information is provided on this After Visit Summary.  MyChart is used to connect with patients for Virtual Visits (Telemedicine).  Patients are able to view lab/test results, encounter notes, upcoming appointments, etc.  Non-urgent messages can be sent to your provider as well.   To learn more about what you can do with MyChart, go to [email or web address].   Other Instructions Thank you for choosing [facility]!

## 2024-06-12 ENCOUNTER — Telehealth: Payer: Self-pay

## 2024-06-12 ENCOUNTER — Encounter (HOSPITAL_COMMUNITY): Payer: Self-pay

## 2024-06-12 NOTE — Telephone Encounter (Signed)
" °  [facility]   MULTIDISCIPLINARY HEART VALVE TEAM  Plan direct admit from home on 1/21.  I spoke with Kenneth [name] in patient placement and made a reservation for admission and instructed them to contact Kenneth [name], pt's daughter, at [phone] when bed is available. The pt requires hydration pre and post TAVR CT scans as advised by Dr Alica. Plan for inpatient TAVR CT scans to be performed on 1/22 and will need to coordinate that the appropriate IV gauge is placed on admission.  Kenneth [name] verbalized understanding of plan.  "

## 2024-06-17 ENCOUNTER — Encounter (HOSPITAL_COMMUNITY): Payer: Self-pay | Admitting: Internal Medicine

## 2024-06-17 ENCOUNTER — Other Ambulatory Visit: Payer: Self-pay

## 2024-06-17 ENCOUNTER — Observation Stay (HOSPITAL_COMMUNITY)
Admission: RE | Admit: 2024-06-17 | Discharge: 2024-06-19 | Disposition: A | Discharge status: Home / Self Care | Source: Ambulatory Visit | Attending: Internal Medicine | Admitting: Internal Medicine

## 2024-06-17 DIAGNOSIS — D631 Anemia in chronic kidney disease: Secondary | ICD-10-CM | POA: Insufficient documentation

## 2024-06-17 DIAGNOSIS — E785 Hyperlipidemia, unspecified: Secondary | ICD-10-CM | POA: Diagnosis not present

## 2024-06-17 DIAGNOSIS — Z856 Personal history of leukemia: Secondary | ICD-10-CM | POA: Insufficient documentation

## 2024-06-17 DIAGNOSIS — I2510 Atherosclerotic heart disease of native coronary artery without angina pectoris: Secondary | ICD-10-CM | POA: Diagnosis not present

## 2024-06-17 DIAGNOSIS — Z79899 Other long term (current) drug therapy: Secondary | ICD-10-CM | POA: Insufficient documentation

## 2024-06-17 DIAGNOSIS — N184 Chronic kidney disease, stage 4 (severe): Secondary | ICD-10-CM | POA: Insufficient documentation

## 2024-06-17 DIAGNOSIS — I447 Left bundle-branch block, unspecified: Secondary | ICD-10-CM

## 2024-06-17 DIAGNOSIS — I5020 Unspecified systolic (congestive) heart failure: Secondary | ICD-10-CM | POA: Diagnosis not present

## 2024-06-17 DIAGNOSIS — I130 Hypertensive heart and chronic kidney disease with heart failure and stage 1 through stage 4 chronic kidney disease, or unspecified chronic kidney disease: Secondary | ICD-10-CM | POA: Diagnosis not present

## 2024-06-17 DIAGNOSIS — I350 Nonrheumatic aortic (valve) stenosis: Principal | ICD-10-CM

## 2024-06-17 DIAGNOSIS — Z952 Presence of prosthetic heart valve: Secondary | ICD-10-CM

## 2024-06-17 DIAGNOSIS — N1832 Chronic kidney disease, stage 3b: Secondary | ICD-10-CM

## 2024-06-17 HISTORY — DX: Chronic kidney disease, stage 3b: N18.32

## 2024-06-17 HISTORY — DX: Nonrheumatic aortic (valve) stenosis: I35.0

## 2024-06-17 HISTORY — DX: Unspecified systolic (congestive) heart failure: I50.20

## 2024-06-17 LAB — COMPREHENSIVE METABOLIC PANEL WITH GFR
ALT: 14 U/L (ref 0–44)
AST: 27 U/L (ref 15–41)
Albumin: 3.9 g/dL (ref 3.5–5.0)
Alkaline Phosphatase: 65 U/L (ref 38–126)
Anion gap: 11 (ref 5–15)
BUN: 32 mg/dL — ABNORMAL HIGH (ref 8–23)
CO2: 22 mmol/L (ref 22–32)
Calcium: 9 mg/dL (ref 8.9–10.3)
Chloride: 110 mmol/L (ref 98–111)
Creatinine, Ser: 2.09 mg/dL — ABNORMAL HIGH (ref 0.61–1.24)
GFR, Estimated: 30 mL/min — ABNORMAL LOW
Glucose, Bld: 93 mg/dL (ref 70–99)
Potassium: 4.6 mmol/L (ref 3.5–5.1)
Sodium: 143 mmol/L (ref 135–145)
Total Bilirubin: 0.2 mg/dL (ref 0.0–1.2)
Total Protein: 6.2 g/dL — ABNORMAL LOW (ref 6.5–8.1)

## 2024-06-17 LAB — CBC WITH DIFFERENTIAL/PLATELET
Abs Immature Granulocytes: 0.02 K/uL (ref 0.00–0.07)
Basophils Absolute: 0.1 K/uL (ref 0.0–0.1)
Basophils Relative: 1 %
Eosinophils Absolute: 0.3 K/uL (ref 0.0–0.5)
Eosinophils Relative: 4 %
HCT: 28.8 % — ABNORMAL LOW (ref 39.0–52.0)
Hemoglobin: 9.3 g/dL — ABNORMAL LOW (ref 13.0–17.0)
Immature Granulocytes: 0 %
Lymphocytes Relative: 32 %
Lymphs Abs: 2.4 K/uL (ref 0.7–4.0)
MCH: 34.2 pg — ABNORMAL HIGH (ref 26.0–34.0)
MCHC: 32.3 g/dL (ref 30.0–36.0)
MCV: 105.9 fL — ABNORMAL HIGH (ref 80.0–100.0)
Monocytes Absolute: 0.7 K/uL (ref 0.1–1.0)
Monocytes Relative: 9 %
Neutro Abs: 4.1 K/uL (ref 1.7–7.7)
Neutrophils Relative %: 54 %
Platelets: 208 K/uL (ref 150–400)
RBC: 2.72 MIL/uL — ABNORMAL LOW (ref 4.22–5.81)
RDW: 13.1 % (ref 11.5–15.5)
WBC: 7.7 K/uL (ref 4.0–10.5)
nRBC: 0 % (ref 0.0–0.2)

## 2024-06-17 LAB — TSH: TSH: <0.1 u[IU]/mL — ABNORMAL LOW (ref 0.350–4.500)

## 2024-06-17 LAB — PROTIME-INR
INR: 1 (ref 0.8–1.2)
Prothrombin Time: 13.8 s (ref 11.4–15.2)

## 2024-06-17 LAB — PRO BRAIN NATRIURETIC PEPTIDE: Pro Brain Natriuretic Peptide: 3077 pg/mL — ABNORMAL HIGH

## 2024-06-17 MED ORDER — SALINE SPRAY 0.65 % NA SOLN: 1.0000 | NASAL | Status: DC | PRN

## 2024-06-17 MED ORDER — IMATINIB MESYLATE 100 MG PO TABS
400.0000 mg | ORAL_TABLET | Freq: Every day | ORAL | Status: DC
  Filled 2024-06-17 (×2): qty 4

## 2024-06-17 MED ORDER — FINASTERIDE 5 MG PO TABS
5.0000 mg | ORAL_TABLET | Freq: Every day | ORAL | Status: DC
  Administered 2024-06-17: 5 mg via ORAL
  Filled 2024-06-17: qty 1

## 2024-06-17 MED ORDER — SODIUM CHLORIDE 0.9 % IV SOLN: INTRAVENOUS | Status: AC

## 2024-06-17 MED ORDER — SODIUM CHLORIDE 0.9% FLUSH: 3.0000 mL | INTRAVENOUS | Status: DC | PRN

## 2024-06-17 MED ORDER — ACETAMINOPHEN 325 MG PO TABS: 650.0000 mg | ORAL_TABLET | ORAL | Status: DC | PRN

## 2024-06-17 MED ORDER — CARVEDILOL 3.125 MG PO TABS
3.1250 mg | ORAL_TABLET | Freq: Two times a day (BID) | ORAL | Status: DC
  Administered 2024-06-17 – 2024-06-19 (×4): 3.125 mg via ORAL
  Filled 2024-06-17 (×4): qty 1

## 2024-06-17 MED ORDER — POLYETHYL GLYCOL-PROPYL GLYCOL 0.4-0.3 % OP GEL: 1.0000 | Freq: Every day | OPHTHALMIC | Status: DC | PRN

## 2024-06-17 MED ORDER — HEPARIN SODIUM (PORCINE) 5000 UNIT/ML IJ SOLN
5000.0000 [IU] | Freq: Three times a day (TID) | INTRAMUSCULAR | Status: DC
  Administered 2024-06-17 – 2024-06-18 (×4): 5000 [IU] via SUBCUTANEOUS
  Filled 2024-06-17 (×5): qty 1

## 2024-06-17 MED ORDER — SODIUM CHLORIDE 0.9% FLUSH
3.0000 mL | Freq: Two times a day (BID) | INTRAVENOUS | Status: DC
  Administered 2024-06-17 – 2024-06-18 (×2): 3 mL via INTRAVENOUS

## 2024-06-17 MED ORDER — TAMSULOSIN HCL 0.4 MG PO CAPS
0.4000 mg | ORAL_CAPSULE | Freq: Every day | ORAL | Status: DC
  Administered 2024-06-17: 0.4 mg via ORAL
  Filled 2024-06-17: qty 1

## 2024-06-17 MED ORDER — ASPIRIN 81 MG PO TBEC
81.0000 mg | DELAYED_RELEASE_TABLET | Freq: Every day | ORAL | Status: DC
  Administered 2024-06-17: 81 mg via ORAL
  Filled 2024-06-17: qty 1

## 2024-06-17 MED ORDER — ONDANSETRON HCL 4 MG/2ML IJ SOLN: 4.0000 mg | Freq: Four times a day (QID) | INTRAMUSCULAR | Status: DC | PRN

## 2024-06-17 MED ORDER — CARVEDILOL 6.25 MG PO TABS
6.2500 mg | ORAL_TABLET | Freq: Two times a day (BID) | ORAL | Status: DC
  Filled 2024-06-17: qty 1

## 2024-06-17 MED ORDER — SODIUM CHLORIDE 0.9 % IV SOLN: 250.0000 mL | INTRAVENOUS | Status: AC | PRN

## 2024-06-17 MED ORDER — SIMVASTATIN 20 MG PO TABS
40.0000 mg | ORAL_TABLET | Freq: Every day | ORAL | Status: DC
  Administered 2024-06-17: 40 mg via ORAL
  Filled 2024-06-17: qty 2
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
## 2024-06-17 NOTE — Plan of Care (Signed)

## 2024-06-17 NOTE — Plan of Care (Signed)
   Problem: Education: Goal: Knowledge of General Education information will improve Description: Including pain rating scale, medication(s)/side effects and non-pharmacologic comfort measures Outcome: Progressing   Problem: Safety: Goal: Ability to remain free from injury will improve Outcome: Progressing

## 2024-06-18 ENCOUNTER — Encounter (HOSPITAL_COMMUNITY): Payer: Self-pay | Admitting: Internal Medicine

## 2024-06-18 ENCOUNTER — Observation Stay (HOSPITAL_COMMUNITY)

## 2024-06-18 DIAGNOSIS — I350 Nonrheumatic aortic (valve) stenosis: Secondary | ICD-10-CM | POA: Diagnosis not present

## 2024-06-18 LAB — BASIC METABOLIC PANEL WITH GFR
Anion gap: 11 (ref 5–15)
BUN: 30 mg/dL — ABNORMAL HIGH (ref 8–23)
CO2: 20 mmol/L — ABNORMAL LOW (ref 22–32)
Calcium: 8.7 mg/dL — ABNORMAL LOW (ref 8.9–10.3)
Chloride: 106 mmol/L (ref 98–111)
Creatinine, Ser: 1.94 mg/dL — ABNORMAL HIGH (ref 0.61–1.24)
GFR, Estimated: 33 mL/min — ABNORMAL LOW
Glucose, Bld: 85 mg/dL (ref 70–99)
Potassium: 4.8 mmol/L (ref 3.5–5.1)
Sodium: 138 mmol/L (ref 135–145)

## 2024-06-18 LAB — CBC
HCT: 28.3 % — ABNORMAL LOW (ref 39.0–52.0)
Hemoglobin: 9.3 g/dL — ABNORMAL LOW (ref 13.0–17.0)
MCH: 34.3 pg — ABNORMAL HIGH (ref 26.0–34.0)
MCHC: 32.9 g/dL (ref 30.0–36.0)
MCV: 104.4 fL — ABNORMAL HIGH (ref 80.0–100.0)
Platelets: 202 K/uL (ref 150–400)
RBC: 2.71 MIL/uL — ABNORMAL LOW (ref 4.22–5.81)
RDW: 12.8 % (ref 11.5–15.5)
WBC: 6.3 K/uL (ref 4.0–10.5)
nRBC: 0.3 % — ABNORMAL HIGH (ref 0.0–0.2)

## 2024-06-18 LAB — T4, FREE: Free T4: 1.54 ng/dL (ref 0.80–2.00)

## 2024-06-18 MED ORDER — SIMVASTATIN 20 MG PO TABS
40.0000 mg | ORAL_TABLET | Freq: Every evening | ORAL | Status: DC
  Administered 2024-06-18: 40 mg via ORAL
  Filled 2024-06-18: qty 2

## 2024-06-18 MED ORDER — IMATINIB MESYLATE 100 MG PO TABS
400.0000 mg | ORAL_TABLET | Freq: Every evening | ORAL | Status: DC
  Administered 2024-06-18: 400 mg via ORAL
  Filled 2024-06-18: qty 4

## 2024-06-18 MED ORDER — ASPIRIN 81 MG PO TBEC
81.0000 mg | DELAYED_RELEASE_TABLET | Freq: Every evening | ORAL | Status: DC
  Administered 2024-06-18: 81 mg via ORAL
  Filled 2024-06-18: qty 1

## 2024-06-18 MED ORDER — TAMSULOSIN HCL 0.4 MG PO CAPS
0.4000 mg | ORAL_CAPSULE | Freq: Every evening | ORAL | Status: DC
  Administered 2024-06-18: 0.4 mg via ORAL
  Filled 2024-06-18: qty 1

## 2024-06-18 MED ORDER — IOHEXOL 350 MG/ML SOLN
95.0000 mL | Freq: Once | INTRAVENOUS | Status: AC | PRN
  Administered 2024-06-18: 95 mL via INTRAVENOUS

## 2024-06-18 MED ORDER — SODIUM CHLORIDE 0.9 % IV SOLN: INTRAVENOUS | Status: AC

## 2024-06-18 MED ORDER — FINASTERIDE 5 MG PO TABS
5.0000 mg | ORAL_TABLET | Freq: Every evening | ORAL | Status: DC
  Administered 2024-06-18: 5 mg via ORAL
  Filled 2024-06-18: qty 1

## 2024-06-18 NOTE — Care Management Obs Status (Signed)
 MEDICARE OBSERVATION STATUS NOTIFICATION   Patient Details  Name: Kenneth [name]. MRN: [id number] Date of Birth: [birth year]   Medicare Observation Status Notification Given:  Yes    Vonzell Arrie Sharps 06/18/2024, 10:34 AM

## 2024-06-18 NOTE — Progress Notes (Signed)
 Procedure Type: Isolated AVR Perioperative Outcome Estimate % Operative Mortality 7.3% Morbidity & Mortality 20.6% Stroke 2.51% Renal Failure 8.39% Reoperation 5.14% Prolonged Ventilation 10.2% Deep Sternal Wound Infection 0.046% Long Hospital Stay (>14 days) 13.2% Short Hospital Stay (<6 days)* 17.2%

## 2024-06-18 NOTE — Discharge Summary (Addendum)
 " HEART AND VASCULAR CENTER   MULTIDISCIPLINARY HEART VALVE TEAM  Discharge Summary    Patient ID: Kenneth Moscato Sr. MRN: [id number]; DOB: 1934-05-11  Admit date: 06/17/2024 Discharge date: 06/19/2024  PCP:  Toribio Jerel MATSU, MD  [facility] Cardiologist:  Alvan Carrier, MD  [facility] Structural heart: [name], MD [facility] Electrophysiologist:  None   Discharge Diagnoses    Principal Problem:   Severe aortic stenosis Active Problems:   CKD stage 3b, GFR 30-44 ml/min (HCC)   HFrEF (heart failure with reduced ejection fraction) (HCC)   Hyperlipidemia   Allergies Allergies[1]  History of Present Illness     Kenneth Matin Mattioli Sr. is a 89 y.o. 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  Mr. Kenneth [name] had been followed for severe aortic stenosis by our structural heart team. Echo in March 2025 with normal LV systolic function and severe aortic stenosis (mean gradient 51 mmHg) with mild to moderate AI. He was followed at that time as he had no symptoms and his LV function was normal. Most recent echo 02/21/24 with LVEF=40-45% with global hypokinesis. Normal RV function. Mild MR. Severe aortic stenosis with mean gradient 34 mmHg (51 mmHg when LV function was normal), AVA 0.8 cm2. He underwent cardiac catheterization on 04/17/2024 which showed mild to moderate nonobstructive CAD and was recommended to continue with workup for TAVR. He has chronic kidney disease with creatinine ~2.1 and GFR of <30. He was sent to nephrology to establish care and obtain nephrology clearance for TAVR scans.    He was seen by Dr. Linder on 05/20/24. It was felt his CKD could be a result of his long term Gleevec  therapy for CML and he was sent back to heme/onc for this and ongoing  anemia which further increases risk for CIN. He cleared him for TAVR scans under the conditions that he receive 12 hours and IVFs before and after the scans with close follow up of kidney function.    Hospital Course     Consultants: none   Severe aortic stenosis: -- CT scans completed this admission.  -- Cardiac gated CTA of the heart reveals anatomical characteristics consistent with aortic stenosis suitable for treatment by transcatheter aortic valve replacement without any significant complicating features and CTA of the aorta and iliac vessels demonstrate what appear to be adequate pelvic vascular access to facilitate a transfemoral approach.   -- Treated with IVFs for 12 hours before and after.    CKD stage IV: -- Creat 2.03 and GFR 31 on the day of discharge. -- Followed by [facility] (previously Dr. Rayburn).  -- Recheck BMET on 06/25/24 when he sees Dr. Daniel.   Coronary artery disease: -- Cardiac catheterization in 03/2024 showed mild to moderate nonobstructive disease.  -- Started on a baby aspirin  81mg  daily.  -- Continue simvastatin  40 mg daily.   Hyperlipidemia LDL goal <70: -- His LDL was at 41 when checked in 01/2024.  -- Continue simvastatin  40 mg daily.   HFmrEF; -- LVEF decline likely 2/2 severe AS. -- No s/s CHF- watch carefully with IV hydration. -- NYHA class II symptoms.  -- GDMT limited by severe AS and CKD stage IV.  -- Started on Coreg  3.125mg  BID.    HTN: -- Allow for permissive HTN given severe AS.  --  Continue Coreg  3.125mg  BID.   Anemia: -- Hg 8.7 on the day of discharge.  -- Seeing heme/onc 06/22/24.   CML:  -- Continue Gleevac. -- Felt to be the cause of progressive CKD. -- Seeing heme/onc 06/22/24 to see if he can potentially stop this.   Low TSH: -- TSH <0.1 -- Normal T4/T3. -- Follow up with PCP.  _____________  Discharge Vitals Blood pressure (!) 109/57, pulse 72, temperature 98.6 F (37 C), temperature source Oral, resp.  rate 20, height 5' 7 (1.702 m), weight 70.5 kg, SpO2 95%.  Filed Weights   06/17/24 1343 06/18/24 0300 06/19/24 0316  Weight: 70.2 kg 70.6 kg 70.5 kg     GEN: Well nourished, well developed in no acute distress NECK: No JVD CARDIAC: RRR, 3/6 harsh SEM @ RUSB. No rubs, gallops RESPIRATORY:  Clear to auscultation without rales, wheezing or rhonchi  ABDOMEN: Soft, non-tender, non-distended EXTREMITIES:  No edema; No deformity.     Disposition   Pt is being discharged home today in good condition.  Follow-up Plans & Appointments     Follow-up Information     Su, Con RAMAN, MD. Go on 06/25/2024.   Specialty: Cardiothoracic Surgery Why: @ 2:30pm, please arrive at least 20 minutes early. Please go to the lab (on the first floor) before or after this appointment to check your kidney function. Contact information: [address] [place] [address] [phone]                  Discharge Medications   Allergies as of 06/19/2024   No Known Allergies      Medication List     TAKE these medications    aspirin  EC 81 MG tablet Take 1 tablet (81 mg total) by mouth every evening. Swallow whole.   carvedilol  3.125 MG tablet Commonly known as: COREG  Take 1 tablet (3.125 mg total) by mouth 2 (two) times daily with a meal.   Eye Vitamins Caps Take 1 capsule by mouth daily after breakfast.   Mens 50+ Multivitamin Tabs Take 1 tablet by mouth daily after breakfast.   finasteride  5 MG tablet Commonly known as: PROSCAR  Take 5 mg by mouth daily after supper.   imatinib  400 MG tablet Commonly known as: GLEEVEC  Take 1,600 mg by mouth daily after supper.   MAGNESIUM PO Take 1 tablet by mouth daily after breakfast.   simvastatin  40 MG tablet Commonly known as: ZOCOR  Take 40 mg by mouth daily after supper.   sodium chloride  0.65 % Soln nasal spray Commonly known as: OCEAN Place 1 spray into both nostrils as needed (sinus irritation).   SYSTANE  OP Place 1 drop into both eyes 2 (two) times daily as needed (dry eyes, irritation).   tamsulosin  0.4 MG Caps capsule Commonly known as: FLOMAX  Take 0.4 mg by mouth daily after supper.   VITAMIN B-12 PO Take 1 tablet by mouth daily after breakfast.   Vitamin D 50 MCG (2000 UT) Caps Take 2,000 Units by mouth daily.   VITAMIN D-3 PO Take 1 capsule by mouth daily after breakfast.            Outstanding Labs/Studies   BMET  ______________________  Duration of Discharge Encounter: APP Time: 15 minutes    Signed, Lamarr Hummer, PA-C 06/19/2024, 8:00 AM [phone]  Wolm Zaida Roys Sr. was seen by me today along with Izetta Hummer RIGGERS. I have personally performed an evaluation on this patient.  My findings are as follows:  89 y.o.  male with severe AS and CKD admitted for hydration pre and post CT scans in workup for TAVR.  Doing well today Creatinine stable at 2.0   Data: EKG(s) and pertinent labs, studies, etc were personally reviewed and interpreted by me:  EKG reviewed by me:  Telemetry reviewed by me: sinus All labs reviewed by me Otherwise, I agree with data as outlined by the advanced practice provider.  Exam performed by me: Gen: NAD Neck: No JVD Cardiac:  RRR with systolic murmur.  Lungs: clear bilaterally Extremities: No LE edema  My Assessment and Plan:  CKD Severe AS  Admitted for hydration pre and post CT scans in workup for TAVR Creatinine stable.  D/c home today.   The patient will be discharged home today  I spent 20 minutes seeing the patient. During that time, I reviewed the labs, telemetry, evaluated their symptoms and performed an examination. This time also included plan formulation, discussion of the plan with the patient and the time spent with documentation.   Signed,  [name], MD  06/19/2024 8:46 AM       [1] No Known Allergies  "

## 2024-06-18 NOTE — Plan of Care (Signed)

## 2024-06-18 NOTE — Progress Notes (Signed)
"  °  Progress Note  Patient Name: Kenneth Douthat Sr. Date of Encounter: 06/18/2024 [facility] Cardiologist: Alvan Carrier, MD   Interval Summary   No acute events overnight  Cr 2.09 today  Vital Signs Vitals:   06/17/24 2300 06/18/24 0300 06/18/24 0757 06/18/24 0835  BP: 113/71 (!) 112/51 (!) 151/64 (!) 130/56  Pulse: 69 75 64 67  Resp: 14 16 19    Temp: 98.4 F (36.9 C) 98.4 F (36.9 C) 98.5 F (36.9 C) 98.4 F (36.9 C)  TempSrc: Oral Oral Oral Oral  SpO2: 98% 97% 96% 98%  Weight:  70.6 kg    Height:  5' 7 (1.702 m)      Intake/Output Summary (Last 24 hours) at 06/18/2024 0900 Last data filed at 06/18/2024 0300 Gross per 24 hour  Intake 120 ml  Output 1250 ml  Net -1130 ml      06/18/2024    3:00 AM 06/17/2024    1:43 PM 06/09/2024   12:58 PM  Last 3 Weights  Weight (lbs) 155 lb 10.3 oz 154 lb 12.2 oz 159 lb 6.4 oz  Weight (kg) 70.6 kg 70.2 kg 72.303 kg      Telemetry/ECG  SR - Personally Reviewed  Physical Exam  GEN: No acute distress.   Neck: No JVD Cardiac: RRR, 3/6 SEM without rubs, or gallops.  Respiratory: Clear to auscultation bilaterally. GI: Soft, nontender, non-distended  MS: No edema  Assessment & Plan  Severe aortic stenosis:  -- In pre TAVR work up. Place 18G AC IV and plan for TAVR scans tomorrow with IV hydration before and after.    CKD stage IV: -- Cr 2.09 today -- IV hydration prior to and 12h after TAVR CTA today -- D/C hopefully today but may be tomorrow   Coronary artery disease: -- Cardiac catheterization in 03/2024 showed mild to moderate nonobstructive disease.  -- ASA 81 -- Simvastatin  40 mg daily.   Hyperlipidemia LDL goal <70: -- His LDL was at 41 when checked in 01/2024.  -- Continue  Simvastatin  40 mg daily.   HFmrEF; -- LVEF decline likely 2/2 severe AS. -- No s/s CHF- watch carefully with IV hydration. -- NYHA class II symptoms.  -- GDMT limited by severe AS and CKD stage IV.  -- Coreg  3.125mg  BID     HTN: -- BP stable today -- Coreg  3.125mg  BID   Anemia: -- Hg 8.9 on labs in 03/2024.  -- Hgb today 9.3, monitor -- Establishing care with heme/onc in near future.    CML:  -- Continue Gleevac. -- Felt to be the cause of progressive CKD. -- Seeing heme/onc 06/22/24 to see if he can potentially stop this.      For questions or updates, please contact [facility] Please consult [email or web address] for contact info under         Signed, Ethelyn Cerniglia K Riese Hellard, MD  Patient ID: Kenneth Zaida Roys Sr., male   DOB: 1933-06-17, 89 y.o.   MRN: [id number]  "

## 2024-06-18 NOTE — Care Management Obs Status (Signed)
 MEDICARE OBSERVATION STATUS NOTIFICATION   Patient Details  Name: Kenneth Vanderheiden Sr. MRN: [id number] Date of Birth: August 12, 1933   Medicare Observation Status Notification Given:  Yes    Vonzell Arrie Sharps 06/18/2024, 10:33 AM

## 2024-06-18 NOTE — Progress Notes (Signed)
 Heart Failure Navigator Progress Note  Assessed for Heart & Vascular TOC clinic readiness.  Patient undergoing TAVR evaluation. No HF TOC at this time.   Navigator available for reassessment of patient.   Duwaine Plant, PharmD, BCPS Heart Failure Stewardship Pharmacist Phone [phone]

## 2024-06-19 ENCOUNTER — Other Ambulatory Visit: Payer: Self-pay | Admitting: Physician Assistant

## 2024-06-19 ENCOUNTER — Encounter (HOSPITAL_COMMUNITY): Payer: Self-pay | Admitting: Internal Medicine

## 2024-06-19 DIAGNOSIS — N184 Chronic kidney disease, stage 4 (severe): Secondary | ICD-10-CM

## 2024-06-19 DIAGNOSIS — N1832 Chronic kidney disease, stage 3b: Secondary | ICD-10-CM

## 2024-06-19 DIAGNOSIS — E785 Hyperlipidemia, unspecified: Secondary | ICD-10-CM

## 2024-06-19 DIAGNOSIS — I350 Nonrheumatic aortic (valve) stenosis: Secondary | ICD-10-CM | POA: Diagnosis not present

## 2024-06-19 DIAGNOSIS — I5020 Unspecified systolic (congestive) heart failure: Secondary | ICD-10-CM

## 2024-06-19 LAB — CBC
HCT: 26.3 % — ABNORMAL LOW (ref 39.0–52.0)
Hemoglobin: 8.7 g/dL — ABNORMAL LOW (ref 13.0–17.0)
MCH: 34.1 pg — ABNORMAL HIGH (ref 26.0–34.0)
MCHC: 33.1 g/dL (ref 30.0–36.0)
MCV: 103.1 fL — ABNORMAL HIGH (ref 80.0–100.0)
Platelets: 187 K/uL (ref 150–400)
RBC: 2.55 MIL/uL — ABNORMAL LOW (ref 4.22–5.81)
RDW: 12.8 % (ref 11.5–15.5)
WBC: 6.4 K/uL (ref 4.0–10.5)
nRBC: 0 % (ref 0.0–0.2)

## 2024-06-19 LAB — BASIC METABOLIC PANEL WITH GFR
Anion gap: 9 (ref 5–15)
BUN: 28 mg/dL — ABNORMAL HIGH (ref 8–23)
CO2: 21 mmol/L — ABNORMAL LOW (ref 22–32)
Calcium: 8.6 mg/dL — ABNORMAL LOW (ref 8.9–10.3)
Chloride: 109 mmol/L (ref 98–111)
Creatinine, Ser: 2.03 mg/dL — ABNORMAL HIGH (ref 0.61–1.24)
GFR, Estimated: 31 mL/min — ABNORMAL LOW
Glucose, Bld: 93 mg/dL (ref 70–99)
Potassium: 4.3 mmol/L (ref 3.5–5.1)
Sodium: 138 mmol/L (ref 135–145)

## 2024-06-19 LAB — T3, FREE: T3, Free: 3.2 pg/mL (ref 2.0–4.4)

## 2024-06-19 MED ORDER — CARVEDILOL 3.125 MG PO TABS: 3.1250 mg | ORAL_TABLET | Freq: Two times a day (BID) | ORAL | 6 refills | Status: AC

## 2024-06-19 MED ORDER — ASPIRIN 81 MG PO TBEC: 81.0000 mg | DELAYED_RELEASE_TABLET | Freq: Every evening | ORAL | Status: AC

## 2024-06-19 NOTE — TOC Transition Note (Signed)
 Transition of Care [facility]) - Discharge Note   Patient Details  Name: Kenneth [name]. MRN: [id number] Date of Birth: January 09, 1934  Transition of Care [facility]) CM/SW Contact:  Waddell Barnie Rama, RN Phone Number: 06/19/2024, 8:37 AM   Clinical Narrative:    For dc today, son at bedside to transport home.  Follow up on AVS.         Patient Goals and CMS Choice            Discharge Placement                       Discharge Plan and Services Additional resources added to the After Visit Summary for                                       Social Drivers of Health (SDOH) Interventions SDOH Screenings   Food Insecurity: No Food Insecurity (06/17/2024)  Housing: Low Risk (06/17/2024)  Transportation Needs: No Transportation Needs (06/17/2024)  Utilities: Not At Risk (06/17/2024)  Social Connections: Moderately Isolated (06/17/2024)  Tobacco Use: Low Risk (06/17/2024)     Readmission Risk Interventions     No data to display

## 2024-06-19 NOTE — Progress Notes (Signed)
 Discharge orders received, IV and telemetry removed.  CCMD notified. Discharge education provided, pt expresses understanding.

## 2024-06-19 NOTE — TOC CM/SW Note (Signed)
 Transition of Care [facility]) - Inpatient Brief Assessment   Patient Details  Name: Porter Moes Sr. MRN: [id number] Date of Birth: Jun 11, 1933  Transition of Care [facility]) CM/SW Contact:    Waddell Barnie Rama, RN Phone Number: 06/19/2024, 8:36 AM   Clinical Narrative: From home with spouse, has PCP and insurance on file, states has no HH services in place at this time or DME at home.  States family member will transport them home at [employer] and family is support system, .  Pta self ambulatory.   There are no ICM  needs identified  at this time.  Please place consult for ICM needs.     Transition of Care Asessment: Insurance and Status: Insurance coverage has been reviewed Patient has primary care physician: Yes Home environment has been reviewed: home alone Prior level of function:: indep Prior/Current Home Services: No current home services Social Drivers of Health Review: SDOH reviewed no interventions necessary Readmission risk has been reviewed: Yes Transition of care needs: no transition of care needs at this time

## 2024-06-19 NOTE — Plan of Care (Signed)
   Problem: Education: Goal: Knowledge of General Education information will improve Description Including pain rating scale, medication(s)/side effects and non-pharmacologic comfort measures Outcome: Progressing

## 2024-06-19 NOTE — Plan of Care (Signed)
" °  Problem: Education: Goal: Knowledge of General Education information will improve Description: Including pain rating scale, medication(s)/side effects and non-pharmacologic comfort measures 06/19/2024 0837 by Arye Weyenberg N, RN Outcome: Adequate for Discharge 06/19/2024 [phone] by Gail Cathryne SAILOR, RN Outcome: Progressing   Problem: Health Behavior/Discharge Planning: Goal: Ability to manage health-related needs will improve Outcome: Adequate for Discharge   Problem: Clinical Measurements: Goal: Ability to maintain clinical measurements within normal limits will improve Outcome: Adequate for Discharge Goal: Will remain free from infection Outcome: Adequate for Discharge Goal: Diagnostic test results will improve Outcome: Adequate for Discharge Goal: Respiratory complications will improve Outcome: Adequate for Discharge Goal: Cardiovascular complication will be avoided Outcome: Adequate for Discharge   Problem: Activity: Goal: Risk for activity intolerance will decrease Outcome: Adequate for Discharge   Problem: Nutrition: Goal: Adequate nutrition will be maintained Outcome: Adequate for Discharge   Problem: Coping: Goal: Level of anxiety will decrease Outcome: Adequate for Discharge   Problem: Elimination: Goal: Will not experience complications related to bowel motility Outcome: Adequate for Discharge Goal: Will not experience complications related to urinary retention Outcome: Adequate for Discharge   Problem: Pain Managment: Goal: General experience of comfort will improve and/or be controlled Outcome: Adequate for Discharge   Problem: Safety: Goal: Ability to remain free from injury will improve Outcome: Adequate for Discharge   Problem: Skin Integrity: Goal: Risk for impaired skin integrity will decrease Outcome: Adequate for Discharge   "

## 2024-06-22 ENCOUNTER — Other Ambulatory Visit: Payer: Self-pay | Admitting: Oncology

## 2024-06-22 ENCOUNTER — Inpatient Hospital Stay: Attending: Oncology | Admitting: Oncology

## 2024-06-22 ENCOUNTER — Inpatient Hospital Stay

## 2024-06-22 DIAGNOSIS — C9211 Chronic myeloid leukemia, BCR/ABL-positive, in remission: Secondary | ICD-10-CM | POA: Diagnosis not present

## 2024-06-22 DIAGNOSIS — N189 Chronic kidney disease, unspecified: Secondary | ICD-10-CM | POA: Insufficient documentation

## 2024-06-22 DIAGNOSIS — Z79899 Other long term (current) drug therapy: Secondary | ICD-10-CM | POA: Insufficient documentation

## 2024-06-22 DIAGNOSIS — I350 Nonrheumatic aortic (valve) stenosis: Secondary | ICD-10-CM | POA: Insufficient documentation

## 2024-06-22 NOTE — Assessment & Plan Note (Addendum)
 Patient has been on Gleevac for 400 mg daily since 2003. The last time he was seen by medical oncology was in 2017 by [facility] with undetectable BCR able PCR.  He was then transferred to Dr. Lajean oncologist at [facility] but oncologist retired and his PCP has been feeling his Gleevac ever since. His PCP has been keeping a close eye on his white blood cell count which still appear to be within normal range. Labs from 06/19/2024 show white count of 6.4, hemoglobin 8.7 and platelet count 187. Discussed with Dr. Davonna and she recommends repeating CML labs and discussing results and plan moving forward in 1 month.  Until then, patient to continue Gleevac.

## 2024-06-22 NOTE — Progress Notes (Signed)
 "  Kenneth [name] OFFICE PROGRESS NOTE  Kenneth Jerel MATSU, MD  ASSESSMENT & PLAN:  I connected with Kenneth Zaida Roys Sr. on 06/22/24 at  2:00 PM EST by telephone visit and verified that I am speaking with the correct person using two identifiers.   I discussed the limitations, risks, security and privacy concerns of performing an evaluation and management service by telemedicine and the availability of in-person appointments. I also discussed with the patient that there may be a patient responsible charge related to this service. The patient expressed understanding and agreed to proceed.   Other persons participating in the visit and their role in the encounter: NP, Patient    Patients location: Home  Providers location: Home  Assessment & Plan CML in remission [name]) Patient has been on Gleevac for 400 mg daily since 2003. The last time he was seen by medical oncology was in 2017 by [facility] with undetectable BCR able PCR.  He was then transferred to Dr. Lajean oncologist at [facility] but oncologist retired and his PCP has been feeling his Gleevac ever since. His PCP has been keeping a close eye on his white blood cell count which still appear to be within normal range. Labs from 06/19/2024 show white count of 6.4, hemoglobin 8.7 and platelet count 187. Discussed with Dr. Davonna and she recommends repeating CML labs and discussing results and plan moving forward in 1 month.  Until then, patient to continue Gleevac.   Orders Placed This Encounter  Procedures   BCR-ABL1 FISH    Standing Status:   Future    Expiration Date:   06/22/2025   CBC with Differential    Standing Status:   Future    Expiration Date:   06/22/2025   Comprehensive metabolic panel    Standing Status:   Future    Expiration Date:   06/22/2025    INTERVAL HISTORY: Patient was referred to hematology/oncology for history of CML BCR able positive currently on Gleevac.  Cardiology referred him back to  us  to see if he can come off of Gleevac as it may be contributing to progressive CKD.  Patient has extensive cardiac history and is followed by cardiology for severe aortic stenosis.  Most recent echo showed an EF of 40 to 45%.  He underwent cardiac catheterization on 04/17/2024 which showed mild to moderate nonobstructive CAD and was recommended to continue with workup for TAVR.  He has CKD with baseline creatinine around 2.1 and GFR of less than 30.  He was recently referred to nephrology.  Patient was followed by [facility] Dr. Lyndol and hematology/oncology.  Patient has had CML chronic phase diagnosed in 2003 and has been on Gleevac ever since.  He was last evaluated in September 2016.  BCR able by PCR showed all transcripts below the detection level.  At that visit, it was recommended he continue Gleevac for 100 mg p.o. daily and follow-up in 6 months.  Patient did ask about coming off of Gleevac but MD advised he continue given risk of recurrence.   Dr. Fortino in [place] retired and his PCP has been refilling. Has not BCR-ABL testing in many years.   Overall, patient is doing well.  His son and daughter bring him to and from his appointments.  Reports he has been doing some research on side effects of Gleevac and knows that both cardiac and kidney damage are possible.  He thinks he could have come off of this medication many years  ago.  SUMMARY OF HEMATOLOGIC HISTORY: Oncology History   No problem history exists.     CBC    Component Value Date/Time   WBC 6.4 06/19/2024 0259   RBC 2.55 (L) 06/19/2024 0259   HGB 8.7 (L) 06/19/2024 0259   HGB 8.9 (L) 04/13/2024 1228   HCT 26.3 (L) 06/19/2024 0259   HCT 27.9 (L) 04/13/2024 1228   PLT 187 06/19/2024 0259   PLT 170 04/13/2024 1228   MCV 103.1 (H) 06/19/2024 0259   MCV 108 (H) 04/13/2024 1228   MCH 34.1 (H) 06/19/2024 0259   MCHC 33.1 06/19/2024 0259   RDW 12.8 06/19/2024 0259   RDW 11.8 04/13/2024 1228   LYMPHSABS 2.4 06/17/2024  1506   MONOABS 0.7 06/17/2024 1506   EOSABS 0.3 06/17/2024 1506   BASOSABS 0.1 06/17/2024 1506       Latest Ref Rng & Units 06/19/2024    2:59 AM 06/18/2024   11:36 AM 06/17/2024    3:06 PM  CMP  Glucose 70 - 99 mg/dL 93  85  93   BUN 8 - 23 mg/dL 28  30  32   Creatinine 0.61 - 1.24 mg/dL 7.96  8.05  7.90   Sodium 135 - 145 mmol/L 138  138  143   Potassium 3.5 - 5.1 mmol/L 4.3  4.8  4.6   Chloride 98 - 111 mmol/L 109  106  110   CO2 22 - 32 mmol/L 21  20  22    Calcium 8.9 - 10.3 mg/dL 8.6  8.7  9.0   Total Protein 6.5 - 8.1 g/dL   6.2   Total Bilirubin 0.0 - 1.2 mg/dL   0.2   Alkaline Phos 38 - 126 U/L   65   AST 15 - 41 U/L   27   ALT 0 - 44 U/L   14      No results found for: FERRITIN, VITAMINB12  There were no vitals filed for this visit.  Review of System:  Review of Systems  Constitutional:  Negative for malaise/fatigue and weight loss.  Respiratory:  Negative for cough, hemoptysis and sputum production.   Cardiovascular:  Negative for chest pain and palpitations.  Neurological:  Negative for dizziness and headaches.    Physical Exam: Physical Exam Neurological:     Mental Status: He is alert and oriented to person, place, and time.      I provided 20 minutes of non face-to-face telephone visit time during this encounter, and > 50% was spent counseling as documented under my assessment & plan.   Kenneth Hope, NP 06/22/2024 2:54 PM "

## 2024-06-23 ENCOUNTER — Inpatient Hospital Stay

## 2024-06-23 DIAGNOSIS — C9211 Chronic myeloid leukemia, BCR/ABL-positive, in remission: Secondary | ICD-10-CM

## 2024-06-23 LAB — COMPREHENSIVE METABOLIC PANEL WITH GFR
ALT: 20 U/L (ref 0–44)
AST: 31 U/L (ref 15–41)
Albumin: 4.1 g/dL (ref 3.5–5.0)
Alkaline Phosphatase: 63 U/L (ref 38–126)
Anion gap: 11 (ref 5–15)
BUN: 29 mg/dL — ABNORMAL HIGH (ref 8–23)
CO2: 23 mmol/L (ref 22–32)
Calcium: 8.9 mg/dL (ref 8.9–10.3)
Chloride: 108 mmol/L (ref 98–111)
Creatinine, Ser: 2.28 mg/dL — ABNORMAL HIGH (ref 0.61–1.24)
GFR, Estimated: 27 mL/min — ABNORMAL LOW
Glucose, Bld: 98 mg/dL (ref 70–99)
Potassium: 5 mmol/L (ref 3.5–5.1)
Sodium: 142 mmol/L (ref 135–145)
Total Bilirubin: 0.2 mg/dL (ref 0.0–1.2)
Total Protein: 6.3 g/dL — ABNORMAL LOW (ref 6.5–8.1)

## 2024-06-23 LAB — CBC WITH DIFFERENTIAL/PLATELET
Abs Immature Granulocytes: 0.02 10*3/uL (ref 0.00–0.07)
Basophils Absolute: 0.1 10*3/uL (ref 0.0–0.1)
Basophils Relative: 1 %
Eosinophils Absolute: 0.3 10*3/uL (ref 0.0–0.5)
Eosinophils Relative: 4 %
HCT: 30 % — ABNORMAL LOW (ref 39.0–52.0)
Hemoglobin: 9.5 g/dL — ABNORMAL LOW (ref 13.0–17.0)
Immature Granulocytes: 0 %
Lymphocytes Relative: 31 %
Lymphs Abs: 2.4 10*3/uL (ref 0.7–4.0)
MCH: 33.7 pg (ref 26.0–34.0)
MCHC: 31.7 g/dL (ref 30.0–36.0)
MCV: 106.4 fL — ABNORMAL HIGH (ref 80.0–100.0)
Monocytes Absolute: 0.8 10*3/uL (ref 0.1–1.0)
Monocytes Relative: 10 %
Neutro Abs: 4 10*3/uL (ref 1.7–7.7)
Neutrophils Relative %: 54 %
Platelets: 211 10*3/uL (ref 150–400)
RBC: 2.82 MIL/uL — ABNORMAL LOW (ref 4.22–5.81)
RDW: 12.9 % (ref 11.5–15.5)
WBC: 7.6 10*3/uL (ref 4.0–10.5)
nRBC: 0 % (ref 0.0–0.2)

## 2024-06-23 NOTE — Progress Notes (Signed)
 " [facility]   MULTIDISCIPLINARY HEART VALVE CLINIC    Kenneth [place] Sr. [place] Medical Record #[id number] Date of Birth: 1933/12/27  Referring: Verlin Lonni BIRCH, MD Primary Care: Toribio Jerel MATSU, MD Primary Cardiologist:Branch, Dorn, MD  Chief Complaint:    Chief Complaint  Patient presents with   Aortic Stenosis    TAVR consult    History of Present Illness:     Kenneth Deloria Sr. is a 89 y.o. male presents for evaluation of severe aortic stenosis and TAVR.  His oncologist heard a murmur and he has been having serial ECHO since then.  His aortic stenosis has now gotten severe.  He reports some worsening fatigue but denies chest pain or palpitations.  He has a history of CML which was diagnosed in 2003.  He has been on Gleevec  for 23 years and recently had a blood test to determine if he can come off Gleevec  or not.    He is independent in his ADLs and lives in a house by himself.  He has strong family support and a grandson who lives across the street.  My measurements: Annulus - Big chunk of LVOT calcium under NCC.  Area 628 mm2, Dia 28.3 mm, Perimeter 92.3 mm Sinuses - R 36.2 mm, L 40.5 mm, N 37.2 mm Coronaries - R 17 mm, L 13.6 mm Access - Adequate transfemoral access  Past Medical History:  Diagnosis Date   CKD stage 3b, GFR 30-44 ml/min (HCC)    CML (chronic myelocytic leukemia) (HCC)    HFrEF (heart failure with reduced ejection fraction) (HCC)    Hyperlipidemia    Osteoarthritis    Severe aortic stenosis     Past Surgical History:  Procedure Laterality Date   APPENDECTOMY     CHOLECYSTECTOMY     CORONARY ANGIOGRAPHY N/A 04/17/2024   Procedure: CORONARY ANGIOGRAPHY;  Surgeon: Verlin Lonni BIRCH, MD;  Location: [facility];  Service: Cardiovascular;  Laterality: N/A;   LAMINECTOMY      Social History:  Tobacco Use History[1]  Social History   Substance and Sexual Activity  Alcohol Use Never      Allergies[2]    Current Outpatient Medications  Medication Sig Dispense Refill   aspirin  EC 81 MG tablet Take 1 tablet (81 mg total) by mouth every evening. Swallow whole.     carvedilol  (COREG ) 3.125 MG tablet Take 1 tablet (3.125 mg total) by mouth 2 (two) times daily with a meal. 60 tablet 6   Cholecalciferol (VITAMIN D) 50 MCG (2000 UT) CAPS Take 2,000 Units by mouth daily.     Cholecalciferol (VITAMIN D-3 PO) Take 1 capsule by mouth daily after breakfast.     Cyanocobalamin  (VITAMIN B-12 PO) Take 1 tablet by mouth daily after breakfast.     finasteride  (PROSCAR ) 5 MG tablet Take 5 mg by mouth daily after supper.     imatinib  (GLEEVEC ) 400 MG tablet Take 1,600 mg by mouth daily after supper.     MAGNESIUM PO Take 1 tablet by mouth daily after breakfast.     Multiple Vitamins-Minerals (EYE VITAMINS) CAPS Take 1 capsule by mouth daily after breakfast.     Multiple Vitamins-Minerals (MENS 50+ MULTIVITAMIN) TABS Take 1 tablet by mouth daily after breakfast.     Polyethyl Glycol-Propyl Glycol (SYSTANE OP) Place 1 drop into both eyes 2 (two) times daily as needed (dry eyes, irritation).     simvastatin  (ZOCOR ) 40 MG tablet Take 40 mg by mouth daily  after supper.     sodium chloride  (OCEAN) 0.65 % SOLN nasal spray Place 1 spray into both nostrils as needed (sinus irritation).     tamsulosin  (FLOMAX ) 0.4 MG CAPS capsule Take 0.4 mg by mouth daily after supper.     No current facility-administered medications for this visit.    (Not in a hospital admission)   Family History  Problem Relation Age of Onset   Kidney failure Mother    Heart attack Father    Heart attack Sister    Prostate cancer Brother      Review of Systems:   Review of Systems  Constitutional:  Positive for malaise/fatigue.  Respiratory:  Positive for shortness of breath.   Cardiovascular:  Negative for chest pain, palpitations and leg swelling.  Gastrointestinal:  Negative for nausea and vomiting.   Neurological:  Negative for dizziness and headaches.      Physical Exam: BP 126/76 (BP Location: Left Arm, Patient Position: Sitting, Cuff Size: Normal)   Pulse 75   Resp 20   Ht 5' 7 (1.702 m)   Wt 155 lb (70.3 kg)   SpO2 96% Comment: RA  BMI 24.28 kg/m  Physical Exam Constitutional:      Appearance: Normal appearance.  Cardiovascular:     Rate and Rhythm: Normal rate and regular rhythm.     Heart sounds: Murmur heard.  Pulmonary:     Breath sounds: Normal breath sounds.  Abdominal:     General: There is no distension.     Palpations: Abdomen is soft.     Tenderness: There is no abdominal tenderness.  Musculoskeletal:     Right lower leg: No edema.     Left lower leg: No edema.  Neurological:     General: No focal deficit present.     Mental Status: He is alert and oriented to person, place, and time.       Cardiac Studies & Procedures   ______________________________________________________________________________________________ CARDIAC CATHETERIZATION  CARDIAC CATHETERIZATION 04/17/2024  Conclusion   Mid RCA to Dist RCA lesion is 30% stenosed.   1st Mrg lesion is 50% stenosed.   Prox Cx lesion is 30% stenosed.   Ost LAD to Mid LAD lesion is 40% stenosed.  Mild to moderate non-obstructive CAD There is mild calcified plaque throughout the proximal LAD The Circumflex has a mild proximal stenosis. The moderate caliber obtuse marginal branch has a moderate stenosis. The large dominant RCA has mild diffuse non-obstructive disease  Recommendations: Medical management of non-obstructive CAD. Continue workup for TAVR.  Findings Coronary Findings Diagnostic  Dominance: Right  Left Anterior Descending Vessel is large. Ost LAD to Mid LAD lesion is 40% stenosed. The lesion is calcified.  Left Circumflex Vessel is large. Prox Cx lesion is 30% stenosed.  First Obtuse Marginal Branch 1st Mrg lesion is 50% stenosed.  Right Coronary Artery Vessel is  large. Mid RCA to Dist RCA lesion is 30% stenosed.  Intervention  No interventions have been documented.     ECHOCARDIOGRAM  ECHOCARDIOGRAM COMPLETE 02/21/2024  Narrative ECHOCARDIOGRAM REPORT    Patient Name:   Kenneth Vaquera Sr. Date of Exam: 02/21/2024 Medical Rec #:  [id number]             Height:       67.0 in Accession #:    [id number]            Weight:       161.0 lb Date of Birth:  26-Nov-1933  BSA:          1.844 m Patient Age:    89 years              BP:           121/60 mmHg Patient Gender: M                     HR:           69 bpm. Exam Location:  [facility]  Procedure: 2D Echo, Strain Analysis, Cardiac Doppler, Color Doppler and PEDOF (Both Spectral and Color Flow Doppler were utilized during procedure).  Indications:    Aortic Stenosis I35.0  History:        Patient has prior history of Echocardiogram examinations, most recent 08/12/2023. Aortic Valve Disease; Risk Factors:Dyslipidemia.  Sonographer:    Koleen Popper RDCS Referring Phys: [id number] DORN FALCON BRANCH   Sonographer Comments: Global longitudinal strain was attempted. IMPRESSIONS   1. Left ventricular ejection fraction, by estimation, is 40 to 45%. The left ventricle has mildly decreased function. The left ventricle demonstrates global hypokinesis. There is mild left ventricular hypertrophy. Left ventricular diastolic parameters are consistent with Grade I diastolic dysfunction (impaired relaxation). Elevated left atrial pressure. The average left ventricular global longitudinal strain is -12.7 %. The global longitudinal strain is abnormal. 2. Right ventricular systolic function is normal. The right ventricular size is normal. Tricuspid regurgitation signal is inadequate for assessing PA pressure. 3. A small pericardial effusion is present. The pericardial effusion is circumferential. There is no evidence of cardiac tamponade. 4. The mitral valve is abnormal. Mild mitral valve  regurgitation. No evidence of mitral stenosis. 5. Severe aortic stenosis. Lower than expected mean gradient may be secondary to decreased LVEF. 08/12/23 echo mean gradient wasd 51 mmHg in setting of normal LV function. . The aortic valve has an indeterminant number of cusps. There is severe calcifcation of the aortic valve. There is severe thickening of the aortic valve. Aortic valve regurgitation is mild to moderate. 6. Aortic dilatation noted. There is mild dilatation of the ascending aorta, measuring 36 mm. 7. The inferior vena cava is normal in size with greater than 50% respiratory variability, suggesting right atrial pressure of 3 mmHg.  FINDINGS Left Ventricle: Left ventricular ejection fraction, by estimation, is 40 to 45%. The left ventricle has mildly decreased function. The left ventricle demonstrates global hypokinesis. The average left ventricular global longitudinal strain is -12.7 %. Strain was performed and the global longitudinal strain is abnormal. The left ventricular internal cavity size was normal in size. There is mild left ventricular hypertrophy. Left ventricular diastolic parameters are consistent with Grade I diastolic dysfunction (impaired relaxation). Elevated left atrial pressure.  Right Ventricle: The right ventricular size is normal. Right vetricular wall thickness was not well visualized. Right ventricular systolic function is normal. Tricuspid regurgitation signal is inadequate for assessing PA pressure.  Left Atrium: Left atrial size was normal in size.  Right Atrium: Right atrial size was normal in size.  Pericardium: A small pericardial effusion is present. The pericardial effusion is circumferential. There is no evidence of cardiac tamponade.  Mitral Valve: The mitral valve is abnormal. There is mild thickening of the mitral valve leaflet(s). There is mild calcification of the mitral valve leaflet(s). Mild mitral annular calcification. Mild mitral valve  regurgitation. No evidence of mitral valve stenosis.  Tricuspid Valve: The tricuspid valve is normal in structure. Tricuspid valve regurgitation is not demonstrated. No evidence of tricuspid stenosis.  Aortic  Valve: Severe aortic stenosis. Lower than expected mean gradient may be secondary to decreased LVEF. 08/12/23 echo mean gradient wasd 51 mmHg in setting of normal LV function. The aortic valve has an indeterminant number of cusps. There is severe calcifcation of the aortic valve. There is severe thickening of the aortic valve. There is severe aortic valve annular calcification. Aortic valve regurgitation is mild to moderate. Aortic regurgitation PHT measures 373 msec. Aortic valve mean gradient measures 34.0 mmHg. Aortic valve peak gradient measures 55.0 mmHg. Aortic valve area, by VTI measures 0.86 cm.  Pulmonic Valve: The pulmonic valve was not well visualized. Pulmonic valve regurgitation is mild. No evidence of pulmonic stenosis.  Aorta: The aortic root is normal in size and structure and aortic dilatation noted. There is mild dilatation of the ascending aorta, measuring 36 mm.  Venous: The inferior vena cava is normal in size with greater than 50% respiratory variability, suggesting right atrial pressure of 3 mmHg.  IAS/Shunts: No atrial level shunt detected by color flow Doppler.   LEFT VENTRICLE PLAX 2D LVIDd:         5.20 cm      Diastology LVIDs:         4.00 cm      LV e' medial:    4.46 cm/s LV PW:         1.20 cm      LV E/e' medial:  17.4 LV IVS:        1.20 cm      LV e' lateral:   2.94 cm/s LVOT diam:     1.80 cm      LV E/e' lateral: 26.4 LV SV:         83 LV SV Index:   45           2D Longitudinal Strain LVOT Area:     2.54 cm     2D Strain GLS Avg:     -12.7 %  LV Volumes (MOD) LV vol d, MOD A2C: 180.0 ml LV vol d, MOD A4C: 221.0 ml LV vol s, MOD A2C: 106.0 ml LV vol s, MOD A4C: 115.0 ml LV SV MOD A2C:     74.0 ml LV SV MOD A4C:     221.0 ml LV SV MOD BP:       84.3 ml  RIGHT VENTRICLE             IVC RV Basal diam:  4.50 cm     IVC diam: 1.80 cm RV Mid diam:    3.30 cm RV S prime:     11.70 cm/s TAPSE (M-mode): 1.6 cm  LEFT ATRIUM             Index        RIGHT ATRIUM           Index LA diam:        3.80 cm 2.06 cm/m   RA Area:     15.80 cm LA Vol (A2C):   55.1 ml 29.88 ml/m  RA Volume:   38.70 ml  20.99 ml/m LA Vol (A4C):   41.0 ml 22.23 ml/m LA Biplane Vol: 47.7 ml 25.87 ml/m AORTIC VALVE AV Area (Vmax):    0.84 cm AV Area (Vmean):   0.80 cm AV Area (VTI):     0.86 cm AV Vmax:           370.80 cm/s AV Vmean:          254.800 cm/s AV VTI:  0.960 m AV Peak Grad:      55.0 mmHg AV Mean Grad:      34.0 mmHg LVOT Vmax:         122.00 cm/s LVOT Vmean:        79.900 cm/s LVOT VTI:          0.325 m LVOT/AV VTI ratio: 0.34 AI PHT:            373 msec  AORTA Ao Root diam: 3.40 cm Ao Asc diam:  3.60 cm  MITRAL VALVE MV Area (PHT): 2.50 cm     SHUNTS MV Decel Time: 303 msec     Systemic VTI:  0.32 m MR Peak grad: 112.8 mmHg    Systemic Diam: 1.80 cm MR Vmax:      531.00 cm/s MV E velocity: 77.60 cm/s MV A velocity: 108.00 cm/s MV E/A ratio:  0.72  Dorn Ross MD Electronically signed by Dorn Ross MD Signature Date/Time: 02/21/2024/12:11:51 PM    Final      CT SCANS  CT CORONARY MORPH W/CTA COR W/SCORE 06/18/2024  Addendum 06/19/2024  8:22 PM ADDENDUM REPORT: 06/19/2024 20:20  EXAM: OVER-READ INTERPRETATION  CT CHEST  The following report is an over-read performed by radiologist Dr. Oneil Devonshire of [facility] on 06/19/2024. This over-read does not include interpretation of cardiac or coronary anatomy or pathology. The coronary calcium score/coronary CTA interpretation by the cardiologist is attached.  COMPARISON:  None.  FINDINGS: Cardiovascular: Aortic atherosclerotic calcifications are noted without aneurysmal dilatation. No pulmonary emboli are  seen.  Mediastinum/Nodes: There are no enlarged lymph nodes within the visualized mediastinum.  Lungs/Pleura: There is no pleural effusion. The visualized lungs appear clear.  Upper abdomen: No significant findings in the visualized upper abdomen.  Musculoskeletal/Chest wall: No chest wall mass or suspicious osseous findings within the visualized chest.  IMPRESSION: Aortic Atherosclerosis (ICD10-I70.0).   Electronically Signed By: Oneil Devonshire M.D. On: 06/19/2024 20:20  Narrative CLINICAL DATA:  Aortic Stenosis  EXAM: Cardiac TAVR CT  TECHNIQUE: The patient was scanned on a Siemens Force 192 slice scanner. A 120 kV retrospective scan was triggered in the ascending thoracic aorta at 140 HU's. Gantry rotation speed was 250 msecs and collimation was .6 mm. No beta blockade or nitro were given. The 3D data set was reconstructed in 5% intervals of the R-R cycle. Systolic and diastolic phases were analyzed on a dedicated work station using MPR, MIP and VRT modes. The patient received 80 cc of contrast.  FINDINGS: Aortic Valve: Calcified tri leaflet AV with score 5594  Aorta: No aneurysm.  Bovine Arch mild calcific atherosclerosis  Sino-tubular Junction: 32.5 mm calcified  Ascending Thoracic Aorta: 38.5 mm  Aortic Arch: 28.6 mm  Descending Thoracic Aorta: 27.2 mm  Sinus of Valsalva Measurements:  Non-coronary: 35.3 mm Height 22.1 mm  Right - coronary: 33.8 mm  Height 22.2 mm  Left -   coronary: 37.4 mm  Height 21.5 mm  Coronary Artery Height above Annulus:  Left Main: 14.5 mm above annulus  Right Coronary: 18.9 mm above annulus  Virtual Basal Annulus Measurements:  Maximum / Minimum Diameter: 30 mm x 24.8 mm Average diameter 28 mm  Perimeter: 90.5 mm  Area: 616 mm2  Coronary Arteries: Sufficient Height above annulus for deployment  Optimum Fluoroscopic Angle for Delivery: LAO 4 degrees caudal 20 degrees  Membranous septal length 7.5  mm  IMPRESSION: 1. Calcified tri leaflet AV with score 5594  2. Annular area of 616 mm2 suitable  for a 29 mm Sapien 3 valve Alternatively a 34 mm Medtronic valve can be considered  3. Optimum angiographic angle for deployment LAO 4 Caudal 20 degrees  4.  Membranous septal length 7.5 mm  5.  Coronary arteries sufficient height above annulus for deployment  Maude Emmer  Electronically Signed: By: Maude Emmer M.D. On: 06/18/2024 11:49     ______________________________________________________________________________________________      ECG NSR    I have independently reviewed the above radiologic studies and discussed with the patient   Recent Lab Findings: Lab Results  Component Value Date   WBC 7.6 06/23/2024   HGB 9.5 (L) 06/23/2024   HCT 30.0 (L) 06/23/2024   PLT 211 06/23/2024   GLUCOSE 96 06/25/2024   ALT 20 06/23/2024   AST 31 06/23/2024   NA 141 06/25/2024   K 5.0 06/25/2024   CL 108 (H) 06/25/2024   CREATININE 2.87 (H) 06/25/2024   BUN 37 (H) 06/25/2024   CO2 20 06/25/2024   TSH <0.100 (L) 06/17/2024   INR 1.0 06/17/2024      Assessment / Plan:   89 y.o. male with severe aortic stenosis.  STS score: 7.3%.  NYHA Class II.  The risks and benefits of transfemoral TAVR were discussed in detail.  The risks included death, stroke, paravalvular leak, aortic dissection, annulus rupture, device embolization, acute myocardial infarction, arrhythmia, heart block or need for permanent pacemaker.  We also discussed possibility of an emergent sternotomy to address any procedural complications.  Based on our discussion, we collectively decided that an emergent sternotomy would not be indicated.  The patient is agreeable to proceed.  Based on my review of his LHC, echo, and CTA, I agree with the multidisciplinary plan to proceed with a transfemoral 29 mm S3 UR TAVR.   I  spent 30 minutes counseling the patient face to face.   Con RAMAN Porche Steinberger 06/26/2024 8:45 AM      [1]  Social History Tobacco Use  Smoking Status Never  Smokeless Tobacco Never  [2] No Known Allergies  "

## 2024-06-25 ENCOUNTER — Ambulatory Visit

## 2024-06-25 VITALS — BP 126/76 | HR 75 | Resp 20 | Ht 67.0 in | Wt 155.0 lb

## 2024-06-25 DIAGNOSIS — I350 Nonrheumatic aortic (valve) stenosis: Secondary | ICD-10-CM

## 2024-06-25 NOTE — Progress Notes (Signed)
 Pre Surgical Assessment: 5 M Walk Test  56M=16.39ft  5 Meter Walk Test- trial 1: 5.98 seconds 5 Meter Walk Test- trial 2: 6.18 seconds 5 Meter Walk Test- trial 3: 6.46 seconds 5 Meter Walk Test Average: 6.21 seconds  Completed without problems.

## 2024-06-26 ENCOUNTER — Ambulatory Visit: Payer: Self-pay | Admitting: Physician Assistant

## 2024-06-26 ENCOUNTER — Other Ambulatory Visit: Payer: Self-pay

## 2024-06-26 DIAGNOSIS — I350 Nonrheumatic aortic (valve) stenosis: Secondary | ICD-10-CM

## 2024-06-26 LAB — BASIC METABOLIC PANEL WITH GFR
BUN/Creatinine Ratio: 13 (ref 10–24)
BUN: 37 mg/dL — ABNORMAL HIGH (ref 8–27)
CO2: 20 mmol/L (ref 20–29)
Calcium: 8.9 mg/dL (ref 8.6–10.2)
Chloride: 108 mmol/L — ABNORMAL HIGH (ref 96–106)
Creatinine, Ser: 2.87 mg/dL — ABNORMAL HIGH (ref 0.76–1.27)
Glucose: 96 mg/dL (ref 70–99)
Potassium: 5 mmol/L (ref 3.5–5.2)
Sodium: 141 mmol/L (ref 134–144)
eGFR: 20 mL/min/{1.73_m2} — ABNORMAL LOW

## 2024-06-26 LAB — BCR-ABL1 FISH
Cells Analyzed: 200
Cells Counted: 200

## 2024-07-03 ENCOUNTER — Other Ambulatory Visit: Payer: Self-pay

## 2024-07-03 ENCOUNTER — Inpatient Hospital Stay (HOSPITAL_COMMUNITY): Admission: RE | Admit: 2024-07-03

## 2024-07-03 ENCOUNTER — Ambulatory Visit (HOSPITAL_COMMUNITY): Admission: RE | Admit: 2024-07-03 | Source: Ambulatory Visit

## 2024-07-03 DIAGNOSIS — Z01818 Encounter for other preprocedural examination: Secondary | ICD-10-CM

## 2024-07-03 DIAGNOSIS — I350 Nonrheumatic aortic (valve) stenosis: Secondary | ICD-10-CM

## 2024-07-03 LAB — CBC
HCT: 31.2 % — ABNORMAL LOW (ref 39.0–52.0)
Hemoglobin: 10.2 g/dL — ABNORMAL LOW (ref 13.0–17.0)
MCH: 34.3 pg — ABNORMAL HIGH (ref 26.0–34.0)
MCHC: 32.7 g/dL (ref 30.0–36.0)
MCV: 105.1 fL — ABNORMAL HIGH (ref 80.0–100.0)
Platelets: 192 10*3/uL (ref 150–400)
RBC: 2.97 MIL/uL — ABNORMAL LOW (ref 4.22–5.81)
RDW: 12.7 % (ref 11.5–15.5)
WBC: 6.4 10*3/uL (ref 4.0–10.5)
nRBC: 0 % (ref 0.0–0.2)

## 2024-07-03 LAB — URINALYSIS, ROUTINE W REFLEX MICROSCOPIC
Bacteria, UA: NONE SEEN
Bilirubin Urine: NEGATIVE
Glucose, UA: NEGATIVE mg/dL
Hgb urine dipstick: NEGATIVE
Ketones, ur: NEGATIVE mg/dL
Nitrite: NEGATIVE
Protein, ur: NEGATIVE mg/dL
Specific Gravity, Urine: 1.017 (ref 1.005–1.030)
pH: 5 (ref 5.0–8.0)

## 2024-07-03 LAB — COMPREHENSIVE METABOLIC PANEL WITH GFR
ALT: 16 U/L (ref 0–44)
AST: 29 U/L (ref 15–41)
Albumin: 4 g/dL (ref 3.5–5.0)
Alkaline Phosphatase: 64 U/L (ref 38–126)
Anion gap: 8 (ref 5–15)
BUN: 33 mg/dL — ABNORMAL HIGH (ref 8–23)
CO2: 23 mmol/L (ref 22–32)
Calcium: 9 mg/dL (ref 8.9–10.3)
Chloride: 108 mmol/L (ref 98–111)
Creatinine, Ser: 2.27 mg/dL — ABNORMAL HIGH (ref 0.61–1.24)
GFR, Estimated: 27 mL/min — ABNORMAL LOW
Glucose, Bld: 105 mg/dL — ABNORMAL HIGH (ref 70–99)
Potassium: 5 mmol/L (ref 3.5–5.1)
Sodium: 139 mmol/L (ref 135–145)
Total Bilirubin: 0.3 mg/dL (ref 0.0–1.2)
Total Protein: 6.3 g/dL — ABNORMAL LOW (ref 6.5–8.1)

## 2024-07-03 LAB — PROTIME-INR
INR: 1 (ref 0.8–1.2)
Prothrombin Time: 13.4 s (ref 11.4–15.2)

## 2024-07-03 LAB — TYPE AND SCREEN
ABO/RH(D): A POS
Antibody Screen: NEGATIVE

## 2024-07-03 LAB — SURGICAL PCR SCREEN
MRSA, PCR: POSITIVE — AB
Staphylococcus aureus: POSITIVE — AB

## 2024-07-03 NOTE — Progress Notes (Signed)
 Notified Kenneth [name] of patient's positive PCR result.

## 2024-07-07 ENCOUNTER — Inpatient Hospital Stay (HOSPITAL_COMMUNITY): Admission: RE | Admit: 2024-07-07 | Admitting: Cardiovascular Disease

## 2024-07-07 ENCOUNTER — Encounter (HOSPITAL_COMMUNITY): Admission: RE | Payer: Self-pay

## 2024-07-07 DIAGNOSIS — I350 Nonrheumatic aortic (valve) stenosis: Secondary | ICD-10-CM

## 2024-07-24 ENCOUNTER — Inpatient Hospital Stay

## 2024-09-15 ENCOUNTER — Ambulatory Visit: Admitting: Cardiology
